# Patient Record
Sex: Female | Born: 1965 | Race: White | Hispanic: No | Smoking: Never smoker
Health system: Southern US, Community
[De-identification: ages and names within clinical notes are randomized; demographics above are authoritative.]

## PROBLEM LIST (undated history)

## (undated) DIAGNOSIS — G43909 Migraine, unspecified, not intractable, without status migrainosus: Secondary | ICD-10-CM

## (undated) DIAGNOSIS — D649 Anemia, unspecified: Secondary | ICD-10-CM

## (undated) DIAGNOSIS — Z5189 Encounter for other specified aftercare: Secondary | ICD-10-CM

## (undated) DIAGNOSIS — N200 Calculus of kidney: Secondary | ICD-10-CM

## (undated) DIAGNOSIS — J45909 Unspecified asthma, uncomplicated: Secondary | ICD-10-CM

## (undated) DIAGNOSIS — K219 Gastro-esophageal reflux disease without esophagitis: Secondary | ICD-10-CM

## (undated) HISTORY — DX: Unspecified asthma, uncomplicated: J45.909

## (undated) HISTORY — DX: Anemia, unspecified: D64.9

## (undated) HISTORY — PX: SPLENECTOMY: SUR1306

## (undated) HISTORY — DX: Encounter for other specified aftercare: Z51.89

## (undated) HISTORY — DX: Gastro-esophageal reflux disease without esophagitis: K21.9

## (undated) HISTORY — DX: Calculus of kidney: N20.0

## (undated) HISTORY — PX: TUBAL LIGATION: SHX77

## (undated) HISTORY — PX: POLYPECTOMY: SHX149

## (undated) HISTORY — PX: UTERINE FIBROID SURGERY: SHX826

---

## 2007-07-30 ENCOUNTER — Ambulatory Visit: Payer: Self-pay | Admitting: Internal Medicine

## 2007-08-08 ENCOUNTER — Encounter: Admission: RE | Admit: 2007-08-08 | Discharge: 2007-08-08 | Payer: Self-pay | Admitting: Internal Medicine

## 2007-09-06 ENCOUNTER — Ambulatory Visit: Payer: Self-pay | Admitting: Internal Medicine

## 2007-09-13 ENCOUNTER — Telehealth: Payer: Self-pay | Admitting: Internal Medicine

## 2007-10-01 ENCOUNTER — Telehealth: Payer: Self-pay | Admitting: Internal Medicine

## 2007-10-15 ENCOUNTER — Encounter: Payer: Self-pay | Admitting: Internal Medicine

## 2007-10-15 ENCOUNTER — Ambulatory Visit: Payer: Self-pay

## 2007-10-21 ENCOUNTER — Telehealth: Payer: Self-pay | Admitting: Internal Medicine

## 2007-10-22 ENCOUNTER — Telehealth: Payer: Self-pay | Admitting: Internal Medicine

## 2007-10-23 ENCOUNTER — Encounter: Admission: RE | Admit: 2007-10-23 | Discharge: 2007-10-23 | Payer: Self-pay | Admitting: Internal Medicine

## 2007-11-11 ENCOUNTER — Telehealth: Payer: Self-pay | Admitting: Internal Medicine

## 2007-12-30 ENCOUNTER — Other Ambulatory Visit: Admission: RE | Admit: 2007-12-30 | Discharge: 2007-12-30 | Payer: Self-pay | Admitting: Family Medicine

## 2008-04-07 ENCOUNTER — Encounter: Admission: RE | Admit: 2008-04-07 | Discharge: 2008-04-07 | Payer: Self-pay | Admitting: Family Medicine

## 2008-10-15 ENCOUNTER — Encounter: Admission: RE | Admit: 2008-10-15 | Discharge: 2008-10-15 | Payer: Self-pay | Admitting: Family Medicine

## 2008-10-21 ENCOUNTER — Encounter: Admission: RE | Admit: 2008-10-21 | Discharge: 2008-10-21 | Payer: Self-pay | Admitting: Family Medicine

## 2009-01-11 ENCOUNTER — Other Ambulatory Visit: Admission: RE | Admit: 2009-01-11 | Discharge: 2009-01-11 | Payer: Self-pay | Admitting: Pediatrics

## 2009-10-18 ENCOUNTER — Encounter: Admission: RE | Admit: 2009-10-18 | Discharge: 2009-10-18 | Payer: Self-pay | Admitting: Family Medicine

## 2010-01-19 ENCOUNTER — Other Ambulatory Visit: Admission: RE | Admit: 2010-01-19 | Discharge: 2010-01-19 | Payer: Self-pay | Admitting: *Deleted

## 2010-02-01 ENCOUNTER — Encounter: Admission: RE | Admit: 2010-02-01 | Discharge: 2010-02-01 | Payer: Self-pay | Admitting: *Deleted

## 2010-02-04 ENCOUNTER — Encounter: Admission: RE | Admit: 2010-02-04 | Discharge: 2010-02-04 | Payer: Self-pay | Admitting: *Deleted

## 2010-02-24 ENCOUNTER — Ambulatory Visit (HOSPITAL_COMMUNITY): Admission: RE | Admit: 2010-02-24 | Discharge: 2010-02-24 | Payer: Self-pay | Admitting: Obstetrics and Gynecology

## 2010-10-28 ENCOUNTER — Encounter: Admission: RE | Admit: 2010-10-28 | Discharge: 2010-10-28 | Payer: Self-pay | Admitting: *Deleted

## 2010-12-19 ENCOUNTER — Encounter: Payer: Self-pay | Admitting: Family Medicine

## 2011-02-20 LAB — BASIC METABOLIC PANEL
CO2: 26 mEq/L (ref 19–32)
Chloride: 106 mEq/L (ref 96–112)
GFR calc Af Amer: >60 mL/min (ref >60)
Glucose, Bld: 89 mg/dL (ref 70–99)
Sodium: 138 mEq/L (ref 135–145)

## 2011-02-20 LAB — CBC
Hemoglobin: 12.3 g/dL (ref 12.0–15.0)
MCHC: 33.7 g/dL (ref 30.0–36.0)
MCV: 91.9 fL (ref 78.0–100.0)
RBC: 3.98 MIL/uL (ref 3.87–5.11)
RDW: 16.1 % — ABNORMAL HIGH (ref 11.5–15.5)

## 2011-03-06 ENCOUNTER — Other Ambulatory Visit: Payer: Self-pay | Admitting: Gynecology

## 2011-03-15 ENCOUNTER — Other Ambulatory Visit: Payer: Self-pay | Admitting: Gynecology

## 2011-04-11 NOTE — Assessment & Plan Note (Signed)
[facility]                           PRIMARY CARE OFFICE NOTE   NAME:[name] [name]                         MRN:          [id number]  DATE:07/30/2007                            DOB:          [birth year]    CHIEF COMPLAINT:  New patient to practice/headaches with numbness on the  left side of face.   HISTORY OF PRESENT ILLNESS:  Patient is a 45 year old white female here  to establish primary care.  She recently moved from [place],  [place], in April 2008.  She is originally from [place].  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  Patient suffered splenic injury as well as perforated bowel.  She is due  for her routine vaccinations including HIB, Pneumovax, meningococcal  vaccine.   She has a history of migraine headache and takes Imitrex four to nine  times per month.  She was previously on Celexa for possible anxiety  disorder/social phobia.  This did not seem to effect migraine frequency.   Over the last one month she has experienced change in her headache  pattern.  She noticed left-sided headaches and perioral numbness  especially left side of her mouth.  She is an avid runner and had to  slow down her training schedule due to exacerbation of headaches after  running.  She also notes exacerbation of headache with coughing and  bending forward.  Currently the severity of her headache is 5/10.  She  denies any other neurologic deficit/complaints.   CURRENT MEDICATION:  Imitrex as needed.   ALLERGIES:  PENICILLIN and VICODIN which causes angioedema.   PAST MEDICAL HISTORY:  1. 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  2. Repair of perforated bowel in 1977.  3. Tubal ligation in 1997.  4. History of migraines.  5. Hyperlipidemia.  6. Allergic rhinitis.   SOCIAL HISTORY:  Patient is married, does not have any children.  Currently works as an Programmer, multimedia.   FAMILY HISTORY:  Father and mother  are both 69.  Father has high  cholesterol, possible bipolar disorder/depression.  There is alcoholism  in her grandparents.  Cousin has breast cancer and uncle noted to have  colon cancer.   HABITS:  Two glasses of wine per night.  No history of tobacco abuse or  recreational drug use.   REVIEW OF SYSTEMS:  As noted above, she denies any visual field deficit,  double vision, blurry vision.  No chest pain, palpitations, dyspnea on  exertion.  Denies heartburn, nausea, vomiting, constipation, diarrhea.  All other systems negative.   PHYSICAL EXAMINATION:  VITAL SIGNS:  Height is 5 feet 6, weight is 148.2  pounds, temperature 98.1, pulse 93, blood pressure 120/72 in the left  arm in the seated position.  GENERAL APPEARANCE:  The patient is a pleasant, thin, 49 -year-old white  female who appears her stated age.  HEENT:  Normocephalic and atraumatic.  Pupils are equal and reactive to  light bilaterally.  Extraocular motility was intact.  Patient was  anicteric.  Conjunctivae within normal  limits.  There was no evidence of  nystagmus.  Visual field was normal bilaterally.  Oropharyngeal exam was  unremarkable.  Uvula was midline.  External auditory canals and tympanic  membranes were clear.  NECK:  Supple, no adenopathy, carotid bruit or thyromegaly.  CHEST:  Normal respiratory effort.  Chest was clear to auscultation  bilaterally.  No wheezing, rhonchi or rales.  CARDIOVASCULAR:  Regular rate and rhythm with no significant murmurs,  rubs, or gallops appreciated.  ABDOMEN:  Soft and nontender with positive bowel sounds and no  organomegaly.  MUSCULOSKELETAL:  No clubbing, cyanosis, or edema.  SKIN:  Warm and dry.  NEUROLOGIC:  Cranial nerves II-XII grossly intact.  She was nonfocal.  She did not have any pronator drift.  No cerebellar signs.  Muscle  strength was 5/5 throughout.  Reflexes were +1 to 2 upper extremity, +1  patella, +1 Achilles.  Did not have any Babinski's  response.   IMPRESSION/RECOMMENDATIONS:  1. Headache with oral paresthesia.  2. Chronic migraine headache.  3. History of anxiety disorder/social phobia.  4. 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  5. History of hyperlipidemia.  6. Health maintenance.   RECOMMENDATIONS:  1. Patient's headache pattern with exacerbation with coughing and      bending forward worrisome for intracranial lesion.  Obtain MRI/MRA      of the brain without contrast.  2. We discussed possibly using Topamax for migraine headaches if MRI      is unremarkable.  3. Patient will be updated with Pneumovax and meningococcal vaccine      today.  On follow-up visit, we will update patient with HIB      vaccine.  4. She was given a prescription for Avelox 400 mg one p.o. daily to      have on hand.  We      discussed low threshold for using antibiotics in case of high      fever.  5. Obtain labs from previous physician performed in January 2008.  6. Follow up office visit after MRI.     Barbette Hair. Artist Pais, DO  Electronically Signed    RDY/MedQ  DD: 07/30/2007  DT: 07/31/2007  Job #: [id number]

## 2011-04-14 ENCOUNTER — Other Ambulatory Visit: Payer: Self-pay | Admitting: Gynecology

## 2011-04-14 ENCOUNTER — Ambulatory Visit (HOSPITAL_BASED_OUTPATIENT_CLINIC_OR_DEPARTMENT_OTHER)
Admission: RE | Admit: 2011-04-14 | Discharge: 2011-04-14 | Disposition: A | Discharge status: Home / Self Care | Payer: PRIVATE HEALTH INSURANCE | Source: Ambulatory Visit | Attending: Gynecology | Admitting: Gynecology

## 2011-04-14 DIAGNOSIS — N938 Other specified abnormal uterine and vaginal bleeding: Secondary | ICD-10-CM | POA: Insufficient documentation

## 2011-04-14 DIAGNOSIS — N949 Unspecified condition associated with female genital organs and menstrual cycle: Secondary | ICD-10-CM | POA: Insufficient documentation

## 2011-04-14 DIAGNOSIS — D250 Submucous leiomyoma of uterus: Secondary | ICD-10-CM | POA: Insufficient documentation

## 2011-05-12 NOTE — Op Note (Signed)
  NAME:  [name] [name], [name] [name] NO.:  [id number]  MEDICAL RECORD NO.:  [id number]          PATIENT TYPE:  LOCATION:  [facility]                         FACILITY:  [facility]  PHYSICIAN:  Gretta Cool, M.D. DATE OF BIRTH:  October 29, 1966  DATE OF PROCEDURE:  04/14/2011 DATE OF DISCHARGE:                              OPERATIVE REPORT   PREOPERATIVE DIAGNOSES:  Enormous submucous fibroid with 2 previous attempts at hysteroscopic resection elsewhere with abnormal uterine bleeding.  POSTOPERATIVE DIAGNOSES:  Enormous submucous fibroid with 2 previous attempts at hysteroscopic resection elsewhere with abnormal uterine bleeding.  PROCEDURE:  Hysteroscopy, resection of enormous submucous fibroid total, plus total endometrial ablation by resection, plus VaporTrode ablation.  SURGEON:  Gretta Cool, M.D.  ANESTHESIA:  IV sedation, paracervical block.  DESCRIPTION OF PROCEDURE:  Under excellent anesthesia as above with the patient prepped and draped in Allen stirrups, the cervix was grasped with single-tooth tenaculum and progressively dilated with a series of Pratt dilators to accommodate 7-mm resectoscope.  The cavity was examined and an enormous fibroid was identified, bulging from a broad area of the right uterine wall in the midportion of the fundus.  The fibroid was progressively resected.  It was rocked back and forth to lift it up out of the capsule of the fibroid.  As it was resected, it progressively bulged into the endometrial cavity.  Eventually, approximately 30 to 40 mL of equivalent of rubbery hard tissue was removed until the entire fibroid was resected from the capsule.  The base of the capsule was treated by cautery to control bleeding and then by VaporTrode.  The entire endometrial cavity was then totally resected down 5 mm or more into the myometrium, so as to remove all gland openings.  The corneal areas were then treated by touch technique with VaporTrode  electrode so as to completely eliminate endometrial glands in the superficial myometrium.  The entire cavity was then treated with VaporTrode to complete the ablation.  At this point, the procedure was terminated without complications.  There was minimal bleeding at reduced pressure. At the end of the procedure, the patient was given Toradol IV for comfort postoperatively.          ______________________________ Gretta Cool, M.D.     CWL/MEDQ  D:  04/14/2011  T:  04/14/2011  Job:  [id number]  cc:   Leonette Most A. Sydnee Cabal, MD Fax: [phone]  [facility]  Electronically Signed by Beather Arbour M.D. on 05/12/2011 11:50:36 AM

## 2011-09-13 ENCOUNTER — Other Ambulatory Visit: Payer: Self-pay | Admitting: Gynecology

## 2011-09-20 ENCOUNTER — Other Ambulatory Visit: Payer: Self-pay | Admitting: Family Medicine

## 2011-09-20 DIAGNOSIS — Z1231 Encounter for screening mammogram for malignant neoplasm of breast: Secondary | ICD-10-CM

## 2011-10-30 ENCOUNTER — Ambulatory Visit
Admission: RE | Admit: 2011-10-30 | Discharge: 2011-10-30 | Disposition: A | Discharge status: Home / Self Care | Payer: PRIVATE HEALTH INSURANCE | Source: Ambulatory Visit | Attending: Family Medicine | Admitting: Family Medicine

## 2011-10-30 DIAGNOSIS — Z1231 Encounter for screening mammogram for malignant neoplasm of breast: Secondary | ICD-10-CM

## 2012-03-18 ENCOUNTER — Other Ambulatory Visit: Payer: Self-pay | Admitting: Gynecology

## 2012-08-26 ENCOUNTER — Encounter (HOSPITAL_COMMUNITY): Payer: Self-pay | Admitting: *Deleted

## 2012-08-26 ENCOUNTER — Emergency Department (HOSPITAL_COMMUNITY)
Admission: EM | Admit: 2012-08-26 | Discharge: 2012-08-26 | Discharge status: Against Medical Advice | Payer: Commercial Managed Care - PPO | Attending: Emergency Medicine | Admitting: Emergency Medicine

## 2012-08-26 DIAGNOSIS — G43909 Migraine, unspecified, not intractable, without status migrainosus: Secondary | ICD-10-CM | POA: Insufficient documentation

## 2012-08-26 HISTORY — DX: Migraine, unspecified, not intractable, without status migrainosus: G43.909

## 2012-08-26 NOTE — ED Notes (Signed)
Pt states she is tired of waiting and is going to leave  Encouraged to stay  Pt chose to leave anyway

## 2012-08-26 NOTE — ED Notes (Signed)
Pt c/o migraine x's "77hours" states has hx of same but no improvement in symptoms with home medication. Pt reports photophobia and noise sensitivity.

## 2012-10-01 ENCOUNTER — Other Ambulatory Visit: Payer: Self-pay | Admitting: Family Medicine

## 2012-10-01 DIAGNOSIS — Z1231 Encounter for screening mammogram for malignant neoplasm of breast: Secondary | ICD-10-CM

## 2012-10-09 ENCOUNTER — Encounter (HOSPITAL_COMMUNITY): Payer: Self-pay

## 2012-10-09 ENCOUNTER — Emergency Department (HOSPITAL_COMMUNITY)
Admission: EM | Admit: 2012-10-09 | Discharge: 2012-10-09 | Disposition: A | Discharge status: Home / Self Care | Payer: Commercial Managed Care - PPO | Attending: Emergency Medicine | Admitting: Emergency Medicine

## 2012-10-09 ENCOUNTER — Emergency Department (HOSPITAL_COMMUNITY): Payer: Commercial Managed Care - PPO

## 2012-10-09 DIAGNOSIS — J069 Acute upper respiratory infection, unspecified: Secondary | ICD-10-CM

## 2012-10-09 DIAGNOSIS — R059 Cough, unspecified: Secondary | ICD-10-CM

## 2012-10-09 DIAGNOSIS — J029 Acute pharyngitis, unspecified: Secondary | ICD-10-CM | POA: Insufficient documentation

## 2012-10-09 DIAGNOSIS — R05 Cough: Secondary | ICD-10-CM | POA: Insufficient documentation

## 2012-10-09 DIAGNOSIS — R0602 Shortness of breath: Secondary | ICD-10-CM | POA: Insufficient documentation

## 2012-10-09 DIAGNOSIS — G43909 Migraine, unspecified, not intractable, without status migrainosus: Secondary | ICD-10-CM | POA: Insufficient documentation

## 2012-10-09 DIAGNOSIS — Z79899 Other long term (current) drug therapy: Secondary | ICD-10-CM | POA: Insufficient documentation

## 2012-10-09 MED ORDER — PREDNISONE 10 MG PO TABS: ORAL_TABLET | ORAL | Status: DC

## 2012-10-09 MED ORDER — GUAIFENESIN-CODEINE 100-10 MG/5ML PO SYRP: 5.0000 mL | ORAL_SOLUTION | Freq: Three times a day (TID) | ORAL | Status: DC | PRN

## 2012-10-09 NOTE — ED Notes (Signed)
Pt being treated for UTI- medication unknown to this pt- started med last night

## 2012-10-09 NOTE — ED Notes (Signed)
Pt presents with NAD_ Pt states URI since Saturday- productive cough started this am-  Pt feels mucous is stuck in throat.  Pt able to speak in complete sentences and handle secretions.  Pt denies CP N/V/D and fever

## 2012-10-09 NOTE — ED Notes (Signed)
Patient transported to CT 

## 2012-10-09 NOTE — ED Provider Notes (Signed)
History     CSN: [id number]  Arrival date & time 10/09/12  1227   First MD Initiated Contact with Patient 10/09/12 1338      Chief Complaint  Patient presents with  . Shortness of Breath  . URI  . Cough    (Consider location/radiation/quality/duration/timing/severity/associated sxs/prior treatment) HPI [name] [name] is a 46 y.o. female who presents with complaint of a cough and difficulty breathing. Pt states she has had URI symptoms for the last 3 days. States productive cough, sore throat. States at times, she has a mucus ball getting stuck in her throat which makes her choke and she is unable to breath. States these episodes scare her that she will choke and wont able to clear airway on her own. Pt denies fever, chills, chest pain. No SOB unless has mucus in her throat. Did not try any medications. Recently started on macrobid for UTI. Past Medical History  Diagnosis Date  . Migraine     Past Surgical History  Procedure Date  . Uterine fibroid surgery   . Splenectomy     No family history on file.  History  Substance Use Topics  . Smoking status: Not on file  . Smokeless tobacco: Not on file  . Alcohol Use: Yes    OB History    Grav Para Term Preterm Abortions TAB SAB Ect Mult Living                  Review of Systems  Constitutional: Negative for fever and chills.  HENT: Negative for congestion, neck pain and neck stiffness.   Respiratory: Negative.   Cardiovascular: Negative.   Gastrointestinal: Negative for nausea, vomiting and abdominal pain.  Musculoskeletal: Negative.   Skin: Negative.   Neurological: Negative for weakness and numbness.    Allergies  Grapefruit extract; Vicodin; and Penicillins  Home Medications   Current Outpatient Rx  Name  Route  Sig  Dispense  Refill  . NITROFURANTOIN MONOHYD MACRO 100 MG PO CAPS   Oral   Take 100 mg by mouth 2 (two) times daily.         . SUMATRIPTAN SUCCINATE 100 MG PO TABS   Oral   Take 50  mg by mouth every 2 (two) hours as needed. For migraine         . TOPIRAMATE 50 MG PO TABS   Oral   Take 50 mg by mouth every evening.         . GUAIFENESIN-CODEINE 100-10 MG/5ML PO SYRP   Oral   Take 5 mLs by mouth 3 (three) times daily as needed for cough.   120 mL   0   . PREDNISONE 10 MG PO TABS      Take 5 tab day 1, take 4 tab day 2, take 3 tab day 3, take 2 tab day 4, and take 1 tab day 5   15 tablet   0     BP 110/58  Pulse 98  Temp 97.7 F (36.5 C) (Oral)  Resp 16  Ht 5\' 6"  (1.676 m)  Wt 150 lb (68.04 kg)  BMI 24.21 kg/m2  SpO2 100%  LMP 09/18/2012  Physical Exam  Nursing note and vitals reviewed. Constitutional: She appears well-developed and well-nourished. No distress.  HENT:  Head: Normocephalic and atraumatic.  Right Ear: External ear normal.  Left Ear: External ear normal.  Mouth/Throat: Oropharynx is clear and moist.       Nasal congestion  Eyes: Conjunctivae normal are normal.  Neck: Neck supple.  Cardiovascular: Normal rate, regular rhythm and normal heart sounds.   Pulmonary/Chest: Effort normal and breath sounds normal. No respiratory distress. She has no wheezes. She has no rales.  Musculoskeletal: She exhibits no edema.  Lymphadenopathy:    She has no cervical adenopathy.  Neurological: She is alert.  Skin: Skin is warm and dry.  Psychiatric: She has a normal mood and affect.    ED Course  Procedures (including critical care time)  Labs Reviewed - No data to display Dg Chest 2 View  10/09/2012  *RADIOLOGY REPORT*  Clinical Data: Short of breath.  Cough.  CHEST - 2 VIEW  Comparison: None.  Findings: Heart size is normal.  Mediastinal shadows are normal. Lungs are clear.  No effusions.  Minimal spinal curvature.  No acute bony finding.  IMPRESSION: Normal chest except for minimal spinal curvature.   Original Report Authenticated By: Paulina Fusi, M.D.    Filed Vitals:   10/09/12 1233 10/09/12 1459  BP: 116/67 110/58  Pulse: 98     Temp: 97.7 F (36.5 C)   TempSrc: Oral   Resp: 16   Height: 5\' 6"  (1.676 m)   Weight: 150 lb (68.04 kg)   SpO2: 100%       1. Cough   2. URI (upper respiratory infection)       MDM  Pt with cough, intermittent choking on her sputum. She is in no distress in the room. Able to speak in full sentenses. VS normal. She is able to cough up the mucus. Will start on cough suppressants, prednisone for inflammation, fluids at home. Will d/c home with close follow up. Pt agreeable to the plan.         Lottie Mussel, PA 10/09/12 1559

## 2012-10-09 NOTE — Discharge Instructions (Signed)
Start taking cough medication as prescribed to suppress your cough. Drink plenty of fluids. Try steamy room to help to break down mucus. Take prednisone to reduce inflammation as prescribed. Follow up with your doctor as needed. Return if worsening. Your chest x-ray today is normal.   Cough, Adult  A cough is a reflex that helps clear your throat and airways. It can help heal the body or may be a reaction to an irritated airway. A cough may only last 2 or 3 weeks (acute) or may last more than 8 weeks (chronic).  CAUSES Acute cough:  Viral or bacterial infections. Chronic cough:  Infections.  Allergies.  Asthma.  Post-nasal drip.  Smoking.  Heartburn or acid reflux.  Some medicines.  Chronic lung problems (COPD).  Cancer. SYMPTOMS   Cough.  Fever.  Chest pain.  Increased breathing rate.  High-pitched whistling sound when breathing (wheezing).  Colored mucus that you cough up (sputum). TREATMENT   A bacterial cough may be treated with antibiotic medicine.  A viral cough must run its course and will not respond to antibiotics.  Your caregiver may recommend other treatments if you have a chronic cough. HOME CARE INSTRUCTIONS   Only take over-the-counter or prescription medicines for pain, discomfort, or fever as directed by your caregiver. Use cough suppressants only as directed by your caregiver.  Use a cold steam vaporizer or humidifier in your bedroom or home to help loosen secretions.  Sleep in a semi-upright position if your cough is worse at night.  Rest as needed.  Stop smoking if you smoke. SEEK IMMEDIATE MEDICAL CARE IF:   You have pus in your sputum.  Your cough starts to worsen.  You cannot control your cough with suppressants and are losing sleep.  You begin coughing up blood.  You have difficulty breathing.  You develop pain which is getting worse or is uncontrolled with medicine.  You have a fever. MAKE SURE YOU:   Understand these  instructions.  Will watch your condition.  Will get help right away if you are not doing well or get worse. Document Released: 05/12/2011 Document Revised: 02/05/2012 Document Reviewed: 05/12/2011 [facility] Patient Information 2013 [place], [place].

## 2012-10-16 NOTE — ED Provider Notes (Signed)
Medical screening examination/treatment/procedure(s) were performed by non-physician practitioner and as supervising physician I was immediately available for consultation/collaboration.  Cheri Guppy, MD 10/16/12 1121

## 2012-10-21 ENCOUNTER — Ambulatory Visit
Admission: RE | Admit: 2012-10-21 | Discharge: 2012-10-21 | Disposition: A | Discharge status: Home / Self Care | Payer: Commercial Managed Care - PPO | Source: Ambulatory Visit | Attending: Family Medicine | Admitting: Family Medicine

## 2012-10-21 ENCOUNTER — Other Ambulatory Visit: Payer: Self-pay | Admitting: Family Medicine

## 2012-10-21 DIAGNOSIS — M25579 Pain in unspecified ankle and joints of unspecified foot: Secondary | ICD-10-CM

## 2012-11-12 ENCOUNTER — Ambulatory Visit
Admission: RE | Admit: 2012-11-12 | Discharge: 2012-11-12 | Disposition: A | Discharge status: Home / Self Care | Payer: Commercial Managed Care - PPO | Source: Ambulatory Visit | Attending: Family Medicine | Admitting: Family Medicine

## 2012-11-12 DIAGNOSIS — Z1231 Encounter for screening mammogram for malignant neoplasm of breast: Secondary | ICD-10-CM

## 2014-02-13 ENCOUNTER — Ambulatory Visit
Admission: RE | Admit: 2014-02-13 | Discharge: 2014-02-13 | Disposition: A | Discharge status: Home / Self Care | Payer: Commercial Managed Care - PPO | Source: Ambulatory Visit | Attending: Family | Admitting: Family

## 2014-02-13 ENCOUNTER — Other Ambulatory Visit: Payer: Self-pay | Admitting: Family

## 2014-02-13 ENCOUNTER — Emergency Department (HOSPITAL_COMMUNITY)
Admission: EM | Admit: 2014-02-13 | Discharge: 2014-02-13 | Disposition: A | Discharge status: Home / Self Care | Payer: Commercial Managed Care - PPO | Source: Home / Self Care

## 2014-02-13 DIAGNOSIS — M545 Low back pain, unspecified: Secondary | ICD-10-CM

## 2014-02-13 DIAGNOSIS — N12 Tubulo-interstitial nephritis, not specified as acute or chronic: Secondary | ICD-10-CM

## 2014-02-13 DIAGNOSIS — R319 Hematuria, unspecified: Secondary | ICD-10-CM

## 2014-02-16 ENCOUNTER — Other Ambulatory Visit: Payer: Self-pay | Admitting: Family

## 2014-02-16 DIAGNOSIS — N83209 Unspecified ovarian cyst, unspecified side: Secondary | ICD-10-CM

## 2014-02-19 ENCOUNTER — Ambulatory Visit
Admission: RE | Admit: 2014-02-19 | Discharge: 2014-02-19 | Disposition: A | Discharge status: Home / Self Care | Payer: Commercial Managed Care - PPO | Source: Ambulatory Visit | Attending: Family | Admitting: Family

## 2014-02-19 DIAGNOSIS — N83209 Unspecified ovarian cyst, unspecified side: Secondary | ICD-10-CM

## 2014-03-06 ENCOUNTER — Encounter: Payer: Self-pay | Admitting: Internal Medicine

## 2014-04-27 ENCOUNTER — Ambulatory Visit (INDEPENDENT_AMBULATORY_CARE_PROVIDER_SITE_OTHER): Payer: Commercial Managed Care - PPO | Admitting: Internal Medicine

## 2014-04-27 ENCOUNTER — Encounter: Payer: Self-pay | Admitting: Internal Medicine

## 2014-04-27 VITALS — BP 100/68 | HR 84 | Ht 65.5 in | Wt 153.0 lb

## 2014-04-27 DIAGNOSIS — R1310 Dysphagia, unspecified: Secondary | ICD-10-CM

## 2014-04-27 DIAGNOSIS — K219 Gastro-esophageal reflux disease without esophagitis: Secondary | ICD-10-CM

## 2014-04-27 NOTE — Progress Notes (Signed)
HISTORY OF PRESENT ILLNESS:  [name] [name] is a 48 y.o. female who presents today regarding GERD. The patient reports that she developed a "scratchy throat" proximal in 1 year ago. She was evaluated by ear nose and throat specialist in November and apparently underwent laryngoscopy which revealed "inflammation". She was told that she had GERD and was placed on omeprazole 40 mg daily. Her symptoms resolved. She came off medication with recurrent symptoms. She resume medication with resolution of symptoms. She is on medication at this time. She has no classic reflux symptoms such as pyrosis. She does report difficulty initiating swallowing with liquids. No true solid food dysphagia. There is a history of esophageal cancer in the family. Her GI review of systems is otherwise negative  REVIEW OF SYSTEMS:  All non-GI ROS negative except for anxiety  Past Medical History  Diagnosis Date  . Migraine   . GERD (gastroesophageal reflux disease)     Past Surgical History  Procedure Laterality Date  . Uterine fibroid surgery    . Splenectomy      Social History [name] [name]  reports that she has never smoked. She has never used smokeless tobacco. She reports that she drinks alcohol. She reports that she does not use illicit drugs.  family history includes Lung cancer in her maternal grandmother and maternal uncle.  Allergies  Allergen Reactions  . Grapefruit Extract     Results of an allergy test  . Vicodin [Hydrocodone-Acetaminophen]   . Penicillins Swelling and Rash       PHYSICAL EXAMINATION: Vital signs: BP 100/68  Pulse 84  Ht 5' 5.5" (1.664 m)  Wt 153 lb (69.4 kg)  BMI 25.06 kg/m2  LMP 04/13/2014 General: Well-developed, well-nourished, no acute distress HEENT: Sclerae are anicteric, conjunctiva pink. Oral mucosa intact Lungs: Clear Heart: Regular Abdomen: soft, nontender, nondistended, no obvious ascites, no peritoneal signs, normal bowel sounds. No organomegaly.  Large transverse upper abdominal incision well-healed without hernia Extremities: No edema Psychiatric: alert and oriented x3. Cooperative     ASSESSMENT:  #1. GERD with pharyngeal symptoms responsive to PPI #2. Vague liquid dysphagia. Question spasm #3. Screening colonoscopy. Due at age 70. She is aware   PLAN: #1. Reflux precautions #2. Continue PPI as this is helping #3. Upper endoscopy to evaluate chronic GERD and dysphagia.The nature of the procedure, as well as the risks, benefits, and alternatives were carefully and thoroughly reviewed with the patient. Ample time for discussion and questions allowed. The patient understood, was satisfied, and agreed to proceed. #4. Contact the office to set up screening colonoscopy at your convenience

## 2014-04-27 NOTE — Patient Instructions (Signed)

## 2014-05-05 ENCOUNTER — Encounter: Payer: Commercial Managed Care - PPO | Admitting: Internal Medicine

## 2014-05-07 ENCOUNTER — Encounter: Payer: Self-pay | Admitting: Internal Medicine

## 2014-05-07 ENCOUNTER — Ambulatory Visit (AMBULATORY_SURGERY_CENTER): Payer: Commercial Managed Care - PPO | Admitting: Internal Medicine

## 2014-05-07 VITALS — BP 106/67 | HR 78 | Temp 97.8°F | Resp 12 | Ht 65.5 in | Wt 153.0 lb

## 2014-05-07 DIAGNOSIS — K219 Gastro-esophageal reflux disease without esophagitis: Secondary | ICD-10-CM

## 2014-05-07 DIAGNOSIS — R1310 Dysphagia, unspecified: Secondary | ICD-10-CM

## 2014-05-07 MED ORDER — SODIUM CHLORIDE 0.9 % IV SOLN: 500.0000 mL | INTRAVENOUS | Status: DC

## 2014-05-07 NOTE — Patient Instructions (Signed)

## 2014-05-07 NOTE — Op Note (Signed)
[place]  [address]. [place], [address]   ENDOSCOPY PROCEDURE REPORT  PATIENT: [name] [name], [name] [name]  MR#: [id number] BIRTHDATE: 03-12-1966 , 47  yrs. old GENDER: Female ENDOSCOPIST: Eustace Quail, MD REFERRED BY:  Cammie Fulp PROCEDURE DATE:  05/07/2014 PROCEDURE:  EGD, diagnostic ASA CLASS:     Class II INDICATIONS:  History of esophageal reflux.  atypical symptoms (scratchy throat, difficulty initiating swallowing liquids) MEDICATIONS: MAC sedation, administered by CRNA and propofol (Diprivan) 200mg  IV TOPICAL ANESTHETIC: none  DESCRIPTION OF PROCEDURE: After the risks benefits and alternatives of the procedure were thoroughly explained, informed consent was obtained.  The LB [id number] endoscope was introduced through the mouth and advanced to the second portion of the duodenum. Without limitations.  The instrument was slowly withdrawn as the mucosa was fully examined.      EXAM:The upper, middle and distal third of the esophagus were carefully inspected and no abnormalities were noted.  The z-line was well seen at the GEJ.  The endoscope was pushed into the fundus which was normal including a retroflexed view.  The antrum, gastric body, first and second part of the duodenum were unremarkable. Retroflexed views revealed no abnormalities.     The scope was then withdrawn from the patient and the procedure completed.  COMPLICATIONS: There were no complications. ENDOSCOPIC IMPRESSION: 1.Normal EGD 2. GERD with pharyngeal symptoms  RECOMMENDATIONS: 1.  Anti-reflux regimen to be followed 2.  Continue PPI as needed to control symptoms 3. Return to the care of your primary provider.  REPEAT EXAM:  eSigned:  Eustace Quail, MD 05/07/2014 12:05 PM   CC:The Patient  , Cammie Fulp,MD

## 2014-05-07 NOTE — Progress Notes (Signed)
Report to PACU, RN, vss, BBS= Clear.  

## 2014-05-08 ENCOUNTER — Telehealth: Payer: Self-pay

## 2014-05-08 NOTE — Telephone Encounter (Signed)
  Follow up Call-  Call back number 05/07/2014  Post procedure Call Back phone  # [phone]  Permission to leave phone message Yes     Patient questions:  Do you have a fever, pain , or abdominal swelling? no Pain Score  0 *  Have you tolerated food without any problems? yes  Have you been able to return to your normal activities? yes  Do you have any questions about your discharge instructions: Diet   no Medications  no Follow up visit  no  Do you have questions or concerns about your Care? no  Actions: * If pain score is 4 or above: No action needed, pain <4.

## 2014-08-27 ENCOUNTER — Other Ambulatory Visit: Payer: Self-pay | Admitting: Family Medicine

## 2014-08-27 ENCOUNTER — Other Ambulatory Visit (HOSPITAL_COMMUNITY)
Admission: RE | Admit: 2014-08-27 | Discharge: 2014-08-27 | Disposition: A | Discharge status: Home / Self Care | Payer: Commercial Managed Care - PPO | Source: Ambulatory Visit | Attending: Family Medicine | Admitting: Family Medicine

## 2014-08-27 DIAGNOSIS — Z0000 Encounter for general adult medical examination without abnormal findings: Secondary | ICD-10-CM | POA: Insufficient documentation

## 2014-09-01 LAB — CYTOLOGY - PAP

## 2014-12-11 ENCOUNTER — Other Ambulatory Visit: Payer: Self-pay

## 2014-12-11 DIAGNOSIS — Z1231 Encounter for screening mammogram for malignant neoplasm of breast: Secondary | ICD-10-CM

## 2014-12-16 ENCOUNTER — Ambulatory Visit: Payer: Commercial Managed Care - PPO

## 2015-01-11 ENCOUNTER — Ambulatory Visit: Payer: Commercial Managed Care - PPO

## 2015-06-03 ENCOUNTER — Emergency Department (HOSPITAL_COMMUNITY)
Admission: EM | Admit: 2015-06-03 | Discharge: 2015-06-04 | Discharge status: Against Medical Advice | Payer: Commercial Managed Care - PPO | Attending: Emergency Medicine | Admitting: Emergency Medicine

## 2015-06-03 DIAGNOSIS — Z862 Personal history of diseases of the blood and blood-forming organs and certain disorders involving the immune mechanism: Secondary | ICD-10-CM | POA: Diagnosis not present

## 2015-06-03 DIAGNOSIS — Z79899 Other long term (current) drug therapy: Secondary | ICD-10-CM | POA: Diagnosis not present

## 2015-06-03 DIAGNOSIS — Z87442 Personal history of urinary calculi: Secondary | ICD-10-CM | POA: Diagnosis not present

## 2015-06-03 DIAGNOSIS — G43909 Migraine, unspecified, not intractable, without status migrainosus: Secondary | ICD-10-CM | POA: Diagnosis not present

## 2015-06-03 DIAGNOSIS — K219 Gastro-esophageal reflux disease without esophagitis: Secondary | ICD-10-CM | POA: Insufficient documentation

## 2015-06-03 DIAGNOSIS — F329 Major depressive disorder, single episode, unspecified: Secondary | ICD-10-CM | POA: Diagnosis not present

## 2015-06-03 DIAGNOSIS — Z880 Allergy status to penicillin: Secondary | ICD-10-CM | POA: Diagnosis not present

## 2015-06-03 DIAGNOSIS — F32A Depression, unspecified: Secondary | ICD-10-CM

## 2015-06-03 DIAGNOSIS — R45851 Suicidal ideations: Secondary | ICD-10-CM | POA: Diagnosis present

## 2015-06-04 ENCOUNTER — Encounter (HOSPITAL_COMMUNITY): Payer: Self-pay

## 2015-06-04 MED ORDER — IBUPROFEN 200 MG PO TABS
600.0000 mg | ORAL_TABLET | Freq: Three times a day (TID) | ORAL | Status: DC | PRN
Start: 2015-06-04 — End: 2015-06-04

## 2015-06-04 MED ORDER — ONDANSETRON HCL 4 MG PO TABS: 4.0000 mg | ORAL_TABLET | Freq: Three times a day (TID) | ORAL | Status: DC | PRN

## 2015-06-04 MED ORDER — ALUM & MAG HYDROXIDE-SIMETH 200-200-20 MG/5ML PO SUSP: 30.0000 mL | ORAL | Status: DC | PRN

## 2015-06-04 MED ORDER — ZOLPIDEM TARTRATE 5 MG PO TABS: 5.0000 mg | ORAL_TABLET | Freq: Every evening | ORAL | Status: DC | PRN

## 2015-06-04 NOTE — ED Notes (Signed)
Patient refused lab work. RN notified.

## 2015-06-04 NOTE — ED Notes (Signed)
Pt states that she is suicidal and depressed because she went through a divorce 4 years ago and can't get over it, she feels like she's  too old to find anyone to be with, her dog died, she's having financial problems and she's scared she's going to be alone forever. She has no plan of SI, but doesn't know what else to do.

## 2015-06-04 NOTE — ED Provider Notes (Signed)
CSN: [id number]     Arrival date & time 06/03/15  2345 History   First MD Initiated Contact with Patient 06/04/15 0038     Chief Complaint  Patient presents with  . Suicidal     (Consider location/radiation/quality/duration/timing/severity/associated sxs/prior Treatment) HPI 49 yo female presents to the ER with complaint of worsening depression, hopelessness, and suicidal thoughts.  Pt reports she has felt desperate for the last year.  She had been on lexapro which helped greatly, but she had weight gain causing her to stop taking it.  She had been seeing a therapist who she did not like, stopped seeing her about 7 weeks ago.  Pt feels hopeless as she is divorced, not dating anyone, and feels she will continue feeling miserable and die alone.  Pt does not have a plan.  She was talking with an exboyfriend tonight and mentioned feeling suicidal, and he called the police.  Pt does not have a gun, has not thought about overdosing or other methods.   Past Medical History  Diagnosis Date  . Migraine   . GERD (gastroesophageal reflux disease)   . Anemia   . Blood transfusion without reported diagnosis   . Kidney stones    Past Surgical History  Procedure Laterality Date  . Uterine fibroid surgery    . Splenectomy    . Tubal ligation     Family History  Problem Relation Age of Onset  . Lung cancer Maternal Grandmother   . Lung cancer Maternal Uncle   . Colon cancer Maternal Uncle    History  Substance Use Topics  . Smoking status: Never Smoker   . Smokeless tobacco: Never Used  . Alcohol Use: Yes     Comment: OCC.   OB History    No data available     Review of Systems   See History of Present Illness; otherwise all other systems are reviewed and negative    Allergies  Grapefruit extract; Vicodin; and Penicillins  Home Medications   Prior to Admission medications   Medication Sig Start Date End Date Taking? Authorizing Provider  omeprazole (PRILOSEC) 40 MG capsule Take  40 mg by mouth daily.   Yes Historical Provider, MD  SUMAtriptan (IMITREX) 100 MG tablet Take 50 mg by mouth every 2 (two) hours as needed. For migraine   Yes Historical Provider, MD   BP 128/72 mmHg  Pulse 82  Temp(Src) 97.3 F (36.3 C) (Oral)  Resp 14  SpO2 97% Physical Exam  Constitutional: She is oriented to person, place, and time. She appears well-developed and well-nourished.  HENT:  Head: Normocephalic and atraumatic.  Nose: Nose normal.  Mouth/Throat: Oropharynx is clear and moist.  Eyes: Conjunctivae and EOM are normal. Pupils are equal, round, and reactive to light.  Neck: Normal range of motion. Neck supple. No JVD present. No tracheal deviation present. No thyromegaly present.  Cardiovascular: Normal rate, regular rhythm, normal heart sounds and intact distal pulses.  Exam reveals no gallop and no friction rub.   No murmur heard. Pulmonary/Chest: Effort normal and breath sounds normal. No stridor. No respiratory distress. She has no wheezes. She has no rales. She exhibits no tenderness.  Abdominal: Soft. Bowel sounds are normal. She exhibits no distension and no mass. There is no tenderness. There is no rebound and no guarding.  Musculoskeletal: Normal range of motion. She exhibits no edema or tenderness.  Lymphadenopathy:    She has no cervical adenopathy.  Neurological: She is alert and oriented to person, place,  and time. She displays normal reflexes. She exhibits normal muscle tone. Coordination normal.  Skin: Skin is warm and dry. No rash noted. No erythema. No pallor.  Psychiatric: Her behavior is normal. Judgment and thought content normal.  Depression, flat affect.  Passive suicide thoughts  Nursing note and vitals reviewed.   ED Course  Procedures (including critical care time) Labs Review Labs Reviewed  CBC WITH DIFFERENTIAL/PLATELET  COMPREHENSIVE METABOLIC PANEL  URINE RAPID DRUG SCREEN, HOSP PERFORMED  ETHANOL  SALICYLATE LEVEL  ACETAMINOPHEN LEVEL     Imaging Review No results found.   EKG Interpretation None      MDM   Final diagnoses:  Depression    49 year old female who reports a year of depression, hopelessness.  Patient has been having thoughts about suicide, but does not have a plan.  No prior history of suicide attempts.  Patient had a therapist but did not go along with her.  She had been treated with Lexapro in the past, but gained weight.  We'll have TTS, evaluate   Patient reports that she does not wish to stay that she has appointments to keep in the morning.  She left before TTS could evaluate her.  I do not feel strongly that she was at severe risk to herself.  She contracted for safety.  She left before resource list could be given her.    Linton Flemings, MD 06/04/15 [phone]

## 2015-06-04 NOTE — ED Notes (Signed)
Pt did not wait for the TTS counselor to come with resource information, pt walked out of the ED and had the police department to take her home

## 2015-06-04 NOTE — ED Notes (Signed)
Patient wanting to leave, Dr Sharol Given aware, requesting resources and contract for safety for patient.

## 2015-12-28 ENCOUNTER — Encounter: Payer: Self-pay | Admitting: Allergy and Immunology

## 2015-12-28 ENCOUNTER — Ambulatory Visit (INDEPENDENT_AMBULATORY_CARE_PROVIDER_SITE_OTHER): Payer: Commercial Managed Care - PPO | Admitting: Allergy and Immunology

## 2015-12-28 VITALS — BP 100/68 | HR 76 | Temp 99.0°F | Resp 12 | Ht 64.96 in | Wt 160.9 lb

## 2015-12-28 DIAGNOSIS — K219 Gastro-esophageal reflux disease without esophagitis: Secondary | ICD-10-CM

## 2015-12-28 DIAGNOSIS — H1010 Acute atopic conjunctivitis, unspecified eye: Secondary | ICD-10-CM | POA: Diagnosis not present

## 2015-12-28 DIAGNOSIS — J387 Other diseases of larynx: Secondary | ICD-10-CM | POA: Diagnosis not present

## 2015-12-28 DIAGNOSIS — J309 Allergic rhinitis, unspecified: Secondary | ICD-10-CM | POA: Diagnosis not present

## 2015-12-28 MED ORDER — RANITIDINE HCL 300 MG PO TABS: 300.0000 mg | ORAL_TABLET | Freq: Every day | ORAL | Status: DC

## 2015-12-28 MED ORDER — MONTELUKAST SODIUM 10 MG PO TABS: 10.0000 mg | ORAL_TABLET | Freq: Every day | ORAL | Status: DC

## 2015-12-28 NOTE — Patient Instructions (Addendum)
  1. Allergen avoidance measures  2. Treat inflammation: Montelukast 10 mg one tablet one time per day  3. Treat reflux:   A. consolidate all caffeine use over the next 4 weeks  B. continue omeprazole 40 mg in a.m.  C. start ranitidine 300 mg in PM  4. If needed: OTC loratadine / cetirizine 10mg  one time per day  5. Return to clinic in 8 weeks or earlier if problem

## 2015-12-28 NOTE — Progress Notes (Signed)
[facility]    Dear Dr. Chapman Fitch,  Thank you for referring [name] [name] to the [facility] on 12/28/2015.   Below is a summation of this patient's evaluation and recommendations.  Thank you for your referral. I will keep you informed about this patient's response to treatment.   If you have any questions please to do hestitate to contact me.   Sincerely,  Jiles Prows, MD [facility]   ______________________________________________________________________    NEW PATIENT NOTE  Referring Provider: Antony Blackbird, MD Primary Provider: Antony Blackbird, MD Date of office visit: 12/28/2015    Subjective:   Chief Complaint:  [name] [name] is a 50 y.o. female with a chief complaint of Gastroesophageal Reflux; Allergies; and Sinusitis  who presents to the clinic on 12/28/2015 with the following problems:  HPI Comments: [name] [name] presents this clinic in evaluation of respiratory tract symptoms. Apparently she carries the diagnosis of laryngopharyngeal reflux followed by Dr. Ernesto Rutherford presenting as a "swollen throat" feeling especially whenever she exercises. She basically feels as though she's been screaming for a prolonged period in time. She has a scratchy voice and throat clearing as well. Her reflux appears to be under pretty good control while using Prilosec 40 mg in the morning and sometimes at nighttime but she still continues to have these complaints. She drinks about 3 large mugs of coffee every morning and an additional coffee in the afternoon and a green tea in the afternoon. She does not consume any chocolate. She has ethanol a few times a week. In addition, she does have minimal nasal symptoms and watery eyes on occasion without any obvious trigger. She does not have any associated anosmia or ugly nasal discharge. She does have  migraine headaches.   Past Medical History  Diagnosis Date  . Migraine   . GERD (gastroesophageal reflux disease)   . Anemia   . Blood transfusion without reported diagnosis   . Kidney stones     Past Surgical History  Procedure Laterality Date  . Uterine fibroid surgery    . Splenectomy    . Tubal ligation      Current outpatient prescriptions:  .  buPROPion (WELLBUTRIN XL) 300 MG 24 hr tablet, Take 300 mg by mouth daily., Disp: , Rfl:  .  omeprazole (PRILOSEC) 40 MG capsule, Take 40 mg by mouth daily., Disp: , Rfl:  .  SUMAtriptan (IMITREX) 100 MG tablet, Take 50 mg by mouth every 2 (two) hours as needed. For migraine, Disp: , Rfl:    Allergies  Allergen Reactions  . Grapefruit Extract     Results of an allergy test  . Vicodin [Hydrocodone-Acetaminophen] Swelling    rash  . Penicillins Swelling and Rash    Review of systems negative except as noted in HPI / PMHx or noted below:  Review of Systems  Constitutional: Negative.   HENT: Negative.   Eyes: Negative.   Respiratory: Negative.   Cardiovascular: Negative.   Gastrointestinal: Negative.   Genitourinary: Negative.   Musculoskeletal: Negative.   Skin: Negative.   Neurological: Negative.   Endo/Heme/Allergies: Negative.   Psychiatric/Behavioral: Negative.     Family History  Problem Relation Age of Onset  . Lung cancer Maternal Grandmother   . Lung cancer Maternal Uncle   . Colon cancer Maternal Uncle     Social History   Social History  . Marital  Status: Divorced    Spouse Name: N/A  . Number of Children: N/A  . Years of Education: N/A   Occupational History  . Editior    Social History Main Topics  . Smoking status: Never Smoker   . Smokeless tobacco: Never Used  . Alcohol Use: Yes     Comment: OCC.  . Drug Use: No  . Sexual Activity: Not on file   Other Topics Concern  . Not on file   Social History Narrative    Environmental and Social history  Lives in a house with a dry  environment, cats and dogs located inside the household, carpeting in the bedroom, no plastic on the bed or pillow, and no smokers located inside the household. 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   Objective:   Filed Vitals:   12/28/15 0824  BP: 100/68  Pulse: 76  Temp: 99 F (37.2 C)  Resp: 12   Height: 5' 4.96" (165 cm) Weight: 160 lb 15 oz (73 kg)  Physical Exam  Constitutional: She is well-developed, well-nourished, and in no distress.  HENT:  Head: Normocephalic. Head is without right periorbital erythema and without left periorbital erythema.  Right Ear: Tympanic membrane, external ear and ear canal normal.  Left Ear: Tympanic membrane, external ear and ear canal normal.  Nose: Nose normal. No mucosal edema or rhinorrhea.  Mouth/Throat: Oropharynx is clear and moist and mucous membranes are normal. No oropharyngeal exudate.  Eyes: Conjunctivae and lids are normal. Pupils are equal, round, and reactive to light.  Neck: Trachea normal. No tracheal deviation present. No thyromegaly present.  Cardiovascular: Normal rate, regular rhythm, S1 normal, S2 normal and normal heart sounds.   No murmur heard. Pulmonary/Chest: Effort normal. No stridor. No tachypnea. No respiratory distress. She has no wheezes. She has no rales. She exhibits no tenderness.  Abdominal: Soft. She exhibits no distension and no mass. There is no hepatosplenomegaly. There is no tenderness. There is no rebound and no guarding.  Musculoskeletal: She exhibits no edema or tenderness.  Lymphadenopathy:       Head (right side): No tonsillar adenopathy present.       Head (left side): No tonsillar adenopathy present.    She has no cervical adenopathy.    She has no axillary adenopathy.  Neurological: She is alert. Gait normal.  Skin: No rash noted. She is not diaphoretic. No erythema. No pallor. Nails show no clubbing.  Psychiatric: Mood and affect normal.     Diagnostics: Allergy skin tests were  performed. She demonstrated hypersensitivity to house dust mite  Assessment and Plan:    1. Allergic rhinoconjunctivitis   2. LPRD (laryngopharyngeal reflux disease)     1. Allergen avoidance measures  2. Treat inflammation: Montelukast 10 mg one tablet one time per day  3. Treat reflux:   A. consolidate all caffeine use over the next 4 weeks  B. continue omeprazole 40 mg in a.m.  C. start ranitidine 300 mg in PM  4. If needed: OTC loratadine / cetirizine 10mg  one time per day  5. Return to clinic in 8 weeks or earlier if problem  Anastaysia should do better with attention to reflux treatment including a rather significant consolidation of her caffeine use over the course of the next 4 weeks in addition to a combination of omeprazole and ranitidine. I've also given her montelukast and allergen avoidance measures instructions for her atopic respiratory and eye disease and she has the option of continuing on an antihistamine.  We'll see how things go over the course of the next 8 weeks.

## 2016-02-22 ENCOUNTER — Ambulatory Visit: Payer: Commercial Managed Care - PPO | Admitting: Allergy and Immunology

## 2016-03-21 ENCOUNTER — Ambulatory Visit: Payer: Commercial Managed Care - PPO | Admitting: Allergy and Immunology

## 2016-04-19 ENCOUNTER — Other Ambulatory Visit: Payer: Self-pay

## 2016-04-19 ENCOUNTER — Other Ambulatory Visit: Payer: Self-pay | Admitting: Family Medicine

## 2016-04-19 ENCOUNTER — Encounter: Payer: Self-pay | Admitting: Internal Medicine

## 2016-04-19 DIAGNOSIS — E2839 Other primary ovarian failure: Secondary | ICD-10-CM

## 2016-04-19 DIAGNOSIS — Z1231 Encounter for screening mammogram for malignant neoplasm of breast: Secondary | ICD-10-CM

## 2016-04-27 ENCOUNTER — Ambulatory Visit: Payer: Commercial Managed Care - PPO

## 2016-05-02 ENCOUNTER — Ambulatory Visit: Payer: Commercial Managed Care - PPO

## 2016-05-02 ENCOUNTER — Inpatient Hospital Stay
Admission: RE | Admit: 2016-05-02 | Discharge: 2016-05-02 | Disposition: A | Discharge status: Home / Self Care | Payer: Commercial Managed Care - PPO | Source: Ambulatory Visit | Attending: Family Medicine | Admitting: Family Medicine

## 2016-05-31 ENCOUNTER — Ambulatory Visit (AMBULATORY_SURGERY_CENTER): Payer: Self-pay

## 2016-05-31 VITALS — Ht 66.0 in | Wt 168.2 lb

## 2016-05-31 DIAGNOSIS — Z800 Family history of malignant neoplasm of digestive organs: Secondary | ICD-10-CM

## 2016-05-31 MED ORDER — SUPREP BOWEL PREP KIT 17.5-3.13-1.6 GM/177ML PO SOLN: 1.0000 | Freq: Once | ORAL | Status: DC

## 2016-05-31 NOTE — Progress Notes (Signed)
No allergies to eggs or soy No diet meds No past problems with anesthesia No home oxygen  Has email and internet; registered for emmi

## 2016-06-05 ENCOUNTER — Encounter: Payer: Self-pay | Admitting: Internal Medicine

## 2016-06-14 ENCOUNTER — Encounter: Payer: Self-pay | Admitting: Internal Medicine

## 2016-06-14 ENCOUNTER — Ambulatory Visit (AMBULATORY_SURGERY_CENTER): Payer: Commercial Managed Care - PPO | Admitting: Internal Medicine

## 2016-06-14 VITALS — BP 100/64 | HR 69 | Temp 98.2°F | Resp 18 | Ht 66.0 in | Wt 168.0 lb

## 2016-06-14 DIAGNOSIS — Z1211 Encounter for screening for malignant neoplasm of colon: Secondary | ICD-10-CM

## 2016-06-14 DIAGNOSIS — Z800 Family history of malignant neoplasm of digestive organs: Secondary | ICD-10-CM

## 2016-06-14 DIAGNOSIS — D125 Benign neoplasm of sigmoid colon: Secondary | ICD-10-CM | POA: Diagnosis not present

## 2016-06-14 DIAGNOSIS — D120 Benign neoplasm of cecum: Secondary | ICD-10-CM | POA: Diagnosis not present

## 2016-06-14 HISTORY — PX: COLONOSCOPY: SHX174

## 2016-06-14 MED ORDER — SODIUM CHLORIDE 0.9 % IV SOLN: 500.0000 mL | INTRAVENOUS | Status: DC

## 2016-06-14 NOTE — Progress Notes (Signed)
Report to PACU, RN, vss, BBS= Clear.  

## 2016-06-14 NOTE — Patient Instructions (Signed)
Impressions/recommendations:  Polyps (handout given) Diverticulosis (handout given) High Fiber diet (handout given) Hemorrhoids (handout given)  YOU HAD AN ENDOSCOPIC PROCEDURE TODAY AT [facility]:   Refer to the procedure report that was given to you for any specific questions about what was found during the examination.  If the procedure report does not answer your questions, please call your gastroenterologist to clarify.  If you requested that your care partner not be given the details of your procedure findings, then the procedure report has been included in a sealed envelope for you to review at your convenience later.  YOU SHOULD EXPECT: Some feelings of bloating in the abdomen. Passage of more gas than usual.  Walking can help get rid of the air that was put into your GI tract during the procedure and reduce the bloating. If you had a lower endoscopy (such as a colonoscopy or flexible sigmoidoscopy) you may notice spotting of blood in your stool or on the toilet paper. If you underwent a bowel prep for your procedure, you may not have a normal bowel movement for a few days.  Please Note:  You might notice some irritation and congestion in your nose or some drainage.  This is from the oxygen used during your procedure.  There is no need for concern and it should clear up in a day or so.  SYMPTOMS TO REPORT IMMEDIATELY:   Following lower endoscopy (colonoscopy or flexible sigmoidoscopy):  Excessive amounts of blood in the stool  Significant tenderness or worsening of abdominal pains  Swelling of the abdomen that is new, acute  Fever of 100F or higher   For urgent or emergent issues, a gastroenterologist can be reached at any hour by calling [phone].   DIET: Your first meal following the procedure should be a small meal and then it is ok to progress to your normal diet. Heavy or fried foods are harder to digest and may make you feel nauseous or bloated.   Likewise, meals heavy in dairy and vegetables can increase bloating.  Drink plenty of fluids but you should avoid alcoholic beverages for 24 hours.  ACTIVITY:  You should plan to take it easy for the rest of today and you should NOT DRIVE or use heavy machinery until tomorrow (because of the sedation medicines used during the test).    FOLLOW UP: Our staff will call the number listed on your records the next business day following your procedure to check on you and address any questions or concerns that you may have regarding the information given to you following your procedure. If we do not reach you, we will leave a message.  However, if you are feeling well and you are not experiencing any problems, there is no need to return our call.  We will assume that you have returned to your regular daily activities without incident.  If any biopsies were taken you will be contacted by phone or by letter within the next 1-3 weeks.  Please call us at [phone] if you have not heard about the biopsies in 3 weeks.    SIGNATURES/CONFIDENTIALITY: You and/or your care partner have signed paperwork which will be entered into your electronic medical record.  These signatures attest to the fact that that the information above on your After Visit Summary has been reviewed and is understood.  Full responsibility of the confidentiality of this discharge information lies with you and/or your care-partner.

## 2016-06-14 NOTE — Op Note (Signed)
[facility] Patient Name: [name] [name] Procedure Date: 06/14/2016 10:40 AM MRN: [id number] Endoscopist: Docia Chuck. Henrene Pastor , MD Age: 50 Referring MD:  Date of Birth: [birth year] Gender: Female Account #: [id number] Procedure:                Colonoscopy, with cold snare polypectomy X4 Indications:              Screening for colorectal malignant neoplasm Medicines:                Monitored Anesthesia Care Procedure:                Pre-Anesthesia Assessment:                           - Prior to the procedure, a History and Physical                            was performed, and patient medications and                            allergies were reviewed. The patient's tolerance of                            previous anesthesia was also reviewed. The risks                            and benefits of the procedure and the sedation                            options and risks were discussed with the patient.                            All questions were answered, and informed consent                            was obtained. Prior Anticoagulants: The patient has                            taken no previous anticoagulant or antiplatelet                            agents. ASA Grade Assessment: II - A patient with                            mild systemic disease. After reviewing the risks                            and benefits, the patient was deemed in                            satisfactory condition to undergo the procedure.                           After obtaining informed consent, the colonoscope  was passed under direct vision. Throughout the                            procedure, the patient's blood pressure, pulse, and                            oxygen saturations were monitored continuously. The                            Model CF-HQ190L [id number]) scope was introduced                            through the anus and advanced to the the cecum,                   identified by appendiceal orifice and ileocecal                            valve. The ileocecal valve, appendiceal orifice,                            and rectum were photographed. The quality of the                            bowel preparation was excellent. The colonoscopy                            was performed without difficulty. The patient                            tolerated the procedure well. The bowel preparation                            used was SUPREP. Scope In: 10:47:34 AM Scope Out: [id number] AM Scope Withdrawal Time: 0 hours 15 minutes 31 seconds  Total Procedure Duration: 0 hours 19 minutes 7 seconds  Findings:                 Four polyps were found in the sigmoid colon and                            cecum. The polyps were 1 to 5 mm in size. These                            polyps were removed with a cold snare. Resection                            and retrieval were complete.                           A few small-mouthed diverticula were found in the                            sigmoid colon.  Internal hemorrhoids were found during retroflexion.                           The exam was otherwise without abnormality on                            direct and retroflexion views. Complications:            No immediate complications. Estimated blood loss:                            None. Estimated Blood Loss:     Estimated blood loss: none. Impression:               - Four 1 to 5 mm polyps in the sigmoid colon and in                            the cecum, removed with a cold snare. Resected and                            retrieved.                           - Diverticulosis in the sigmoid colon.                           - Internal hemorrhoids.                           - The examination was otherwise normal on direct                            and retroflexion views. Recommendation:           - Repeat colonoscopy in 5 years for  surveillance.                           - Resume previous diet.                           - Continue present medications.                           - Await pathology results. Docia Chuck. Henrene Pastor, MD 06/14/2016 11:14:40 AM This report has been signed electronically. CC Letter to:             Derinda Late

## 2016-06-14 NOTE — Progress Notes (Signed)
Called to room to assist during endoscopic procedure.  Patient ID and intended procedure confirmed with present staff. Received instructions for my participation in the procedure from the performing physician.  

## 2016-06-15 ENCOUNTER — Telehealth: Payer: Self-pay | Admitting: *Deleted

## 2016-06-15 NOTE — Telephone Encounter (Signed)
  Follow up Call-  Call back number 06/14/2016 05/07/2014  Post procedure Call Back phone  # [phone]  Permission to leave phone message Yes Yes     Patient questions:  Do you have a fever, pain , or abdominal swelling? No. Pain Score  0 *  Have you tolerated food without any problems? Yes.    Have you been able to return to your normal activities? Yes.    Do you have any questions about your discharge instructions: Diet   No. Medications  No. Follow up visit  No.  Do you have questions or concerns about your Care? No.  Actions: * If pain score is 4 or above: No action needed, pain <4.

## 2016-06-21 ENCOUNTER — Encounter: Payer: Self-pay | Admitting: Internal Medicine

## 2016-07-19 ENCOUNTER — Other Ambulatory Visit: Payer: Self-pay | Admitting: Family Medicine

## 2017-01-15 ENCOUNTER — Other Ambulatory Visit: Payer: Self-pay | Admitting: Family Medicine

## 2017-01-15 ENCOUNTER — Ambulatory Visit
Admission: RE | Admit: 2017-01-15 | Discharge: 2017-01-15 | Disposition: A | Discharge status: Home / Self Care | Payer: Commercial Managed Care - PPO | Source: Ambulatory Visit | Attending: Family Medicine | Admitting: Family Medicine

## 2017-01-15 DIAGNOSIS — S92515A Nondisplaced fracture of proximal phalanx of left lesser toe(s), initial encounter for closed fracture: Secondary | ICD-10-CM | POA: Diagnosis not present

## 2017-01-15 DIAGNOSIS — M79675 Pain in left toe(s): Secondary | ICD-10-CM

## 2017-02-19 DIAGNOSIS — M7710 Lateral epicondylitis, unspecified elbow: Secondary | ICD-10-CM | POA: Diagnosis not present

## 2017-02-19 DIAGNOSIS — S92505D Nondisplaced unspecified fracture of left lesser toe(s), subsequent encounter for fracture with routine healing: Secondary | ICD-10-CM | POA: Diagnosis not present

## 2017-06-11 DIAGNOSIS — M7712 Lateral epicondylitis, left elbow: Secondary | ICD-10-CM | POA: Diagnosis not present

## 2017-06-11 DIAGNOSIS — G43909 Migraine, unspecified, not intractable, without status migrainosus: Secondary | ICD-10-CM | POA: Diagnosis not present

## 2017-07-04 IMAGING — CR DG FOOT COMPLETE 3+V*L*
3 series · 3 of 3 positions shown · non-contrast
Comparison: None.

CLINICAL DATA: Pain and swelling in the lateral forefoot after
injury last night. Question fifth metatarsal fracture.

EXAM:
LEFT FOOT - COMPLETE 3+ VIEW

[x foot ap left]
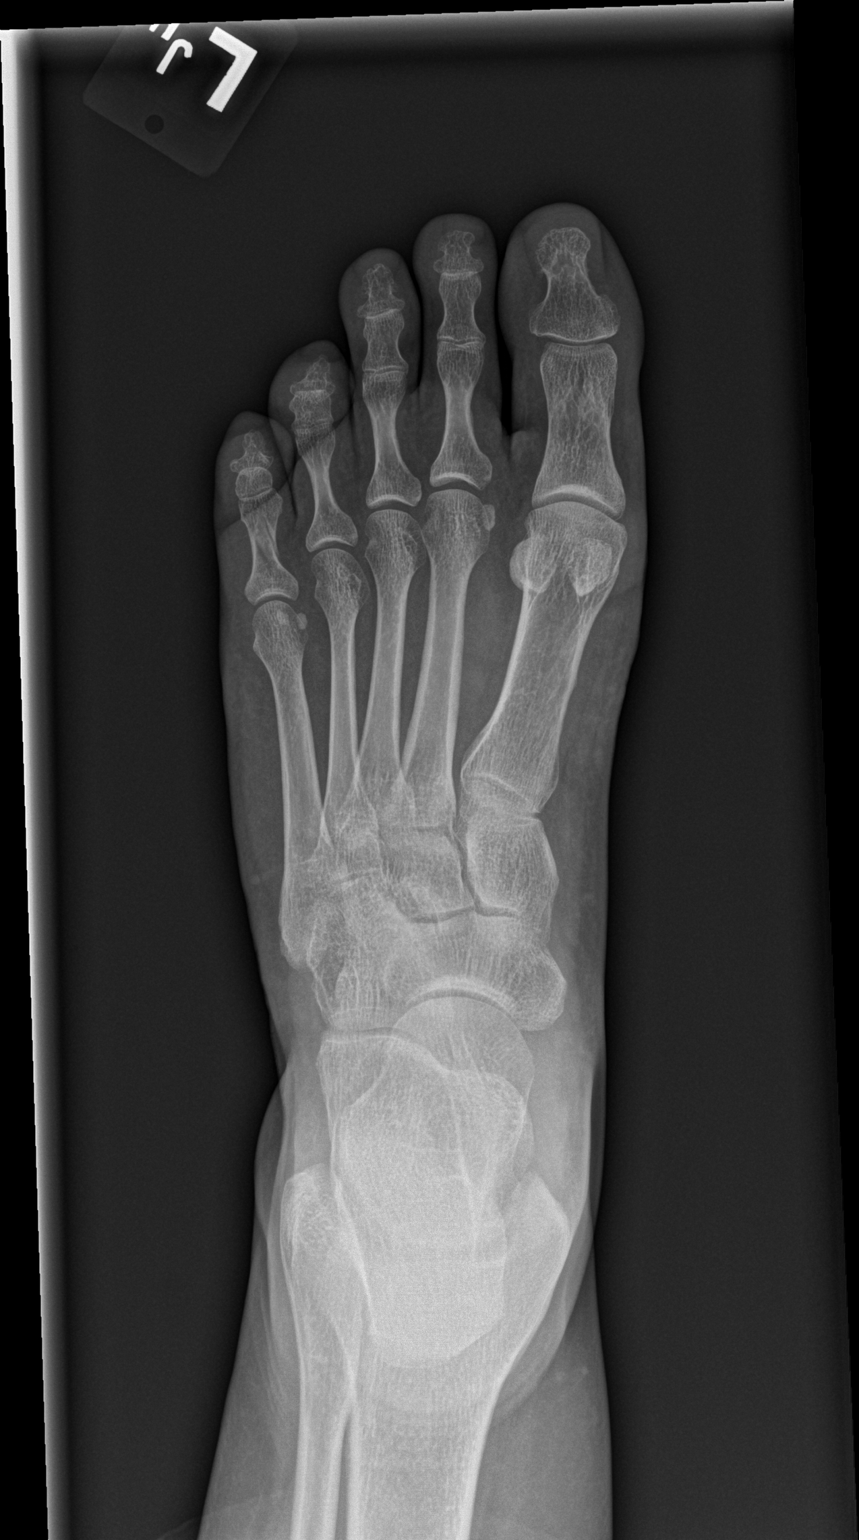

[x foot obl left]
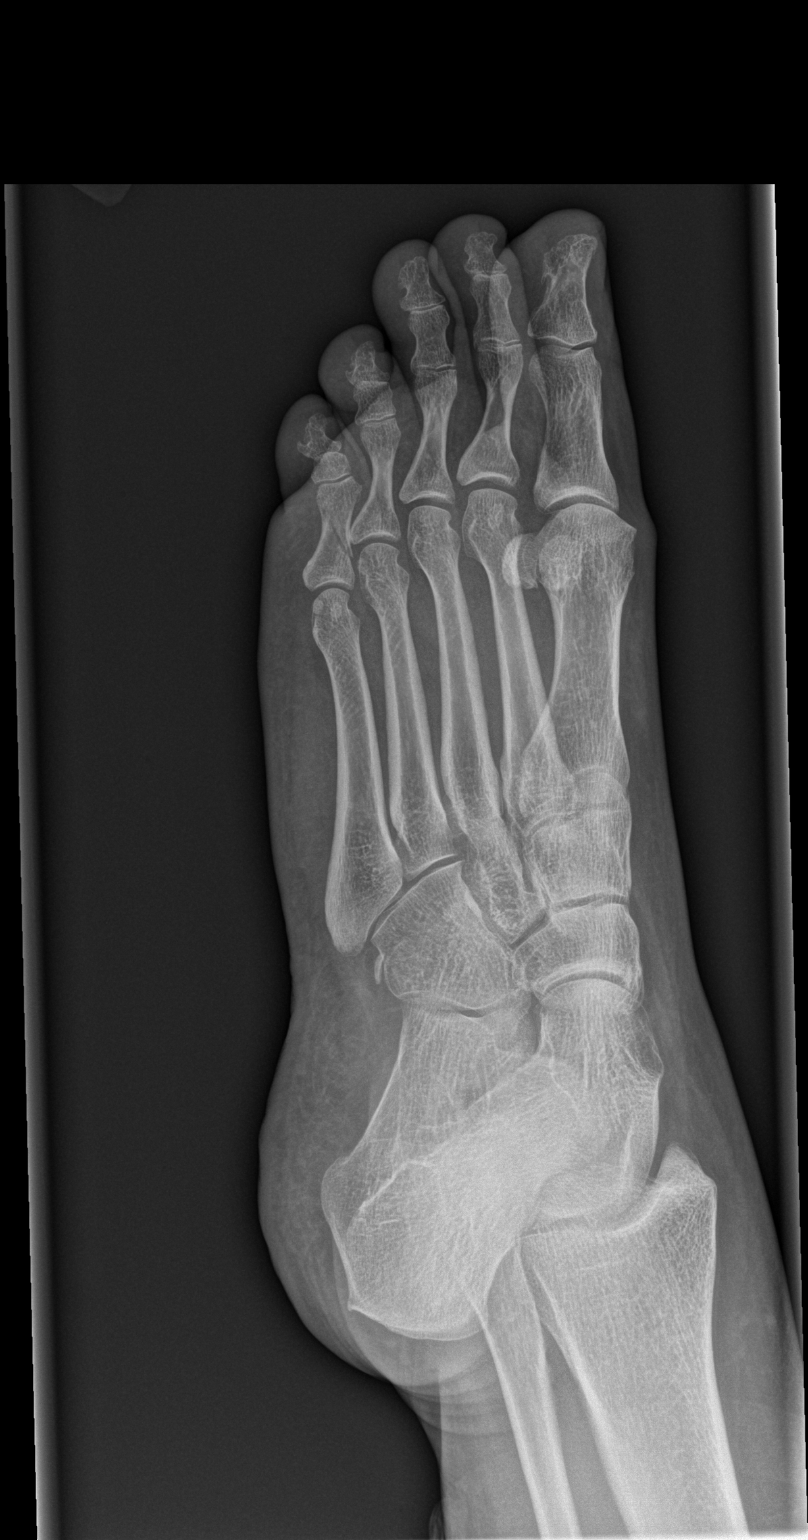

[x foot lat left]
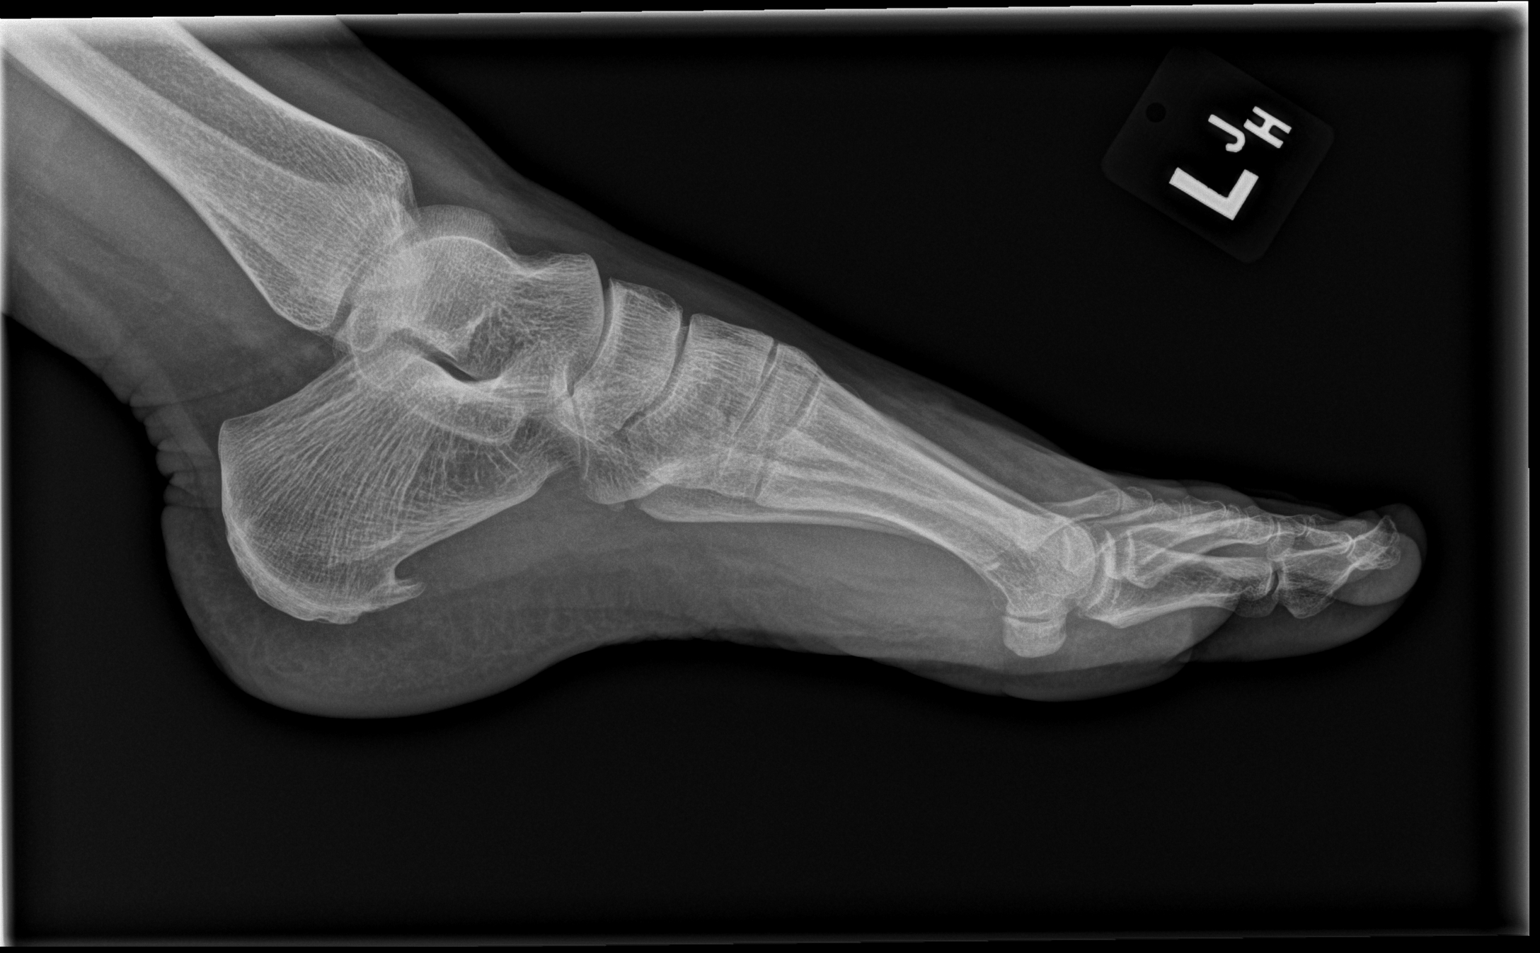

[3 of 3 positions shown; findings below may reference images not displayed]

FINDINGS: There is an oblique nondisplaced fracture involving the fifth
proximal phalanx. This demonstrates no definite intra-articular
extension. No other fractures are seen. The fifth metatarsal appears
intact. The alignment is normal at the Lisfranc joint. Mild forefoot
soft tissue swelling and small plantar calcaneal spur are noted.
IMPRESSION: Nondisplaced oblique fracture of the fifth proximal phalanx.

## 2017-09-06 DIAGNOSIS — Z0000 Encounter for general adult medical examination without abnormal findings: Secondary | ICD-10-CM | POA: Diagnosis not present

## 2017-09-10 ENCOUNTER — Other Ambulatory Visit: Payer: Self-pay | Admitting: Family Medicine

## 2017-09-10 DIAGNOSIS — Z1211 Encounter for screening for malignant neoplasm of colon: Secondary | ICD-10-CM | POA: Diagnosis not present

## 2017-09-10 DIAGNOSIS — Z1231 Encounter for screening mammogram for malignant neoplasm of breast: Secondary | ICD-10-CM

## 2017-09-10 DIAGNOSIS — Z0000 Encounter for general adult medical examination without abnormal findings: Secondary | ICD-10-CM | POA: Diagnosis not present

## 2017-09-10 DIAGNOSIS — M79676 Pain in unspecified toe(s): Secondary | ICD-10-CM | POA: Diagnosis not present

## 2017-09-10 DIAGNOSIS — H6121 Impacted cerumen, right ear: Secondary | ICD-10-CM | POA: Diagnosis not present

## 2017-09-10 DIAGNOSIS — K219 Gastro-esophageal reflux disease without esophagitis: Secondary | ICD-10-CM | POA: Diagnosis not present

## 2017-09-10 DIAGNOSIS — Z23 Encounter for immunization: Secondary | ICD-10-CM | POA: Diagnosis not present

## 2017-09-26 ENCOUNTER — Ambulatory Visit: Payer: Commercial Managed Care - PPO

## 2017-10-25 DIAGNOSIS — Z1231 Encounter for screening mammogram for malignant neoplasm of breast: Secondary | ICD-10-CM | POA: Diagnosis not present

## 2017-11-07 DIAGNOSIS — K219 Gastro-esophageal reflux disease without esophagitis: Secondary | ICD-10-CM | POA: Diagnosis not present

## 2017-11-07 DIAGNOSIS — N951 Menopausal and female climacteric states: Secondary | ICD-10-CM | POA: Diagnosis not present

## 2017-11-07 DIAGNOSIS — G43909 Migraine, unspecified, not intractable, without status migrainosus: Secondary | ICD-10-CM | POA: Diagnosis not present

## 2017-12-12 DIAGNOSIS — G43909 Migraine, unspecified, not intractable, without status migrainosus: Secondary | ICD-10-CM | POA: Diagnosis not present

## 2017-12-12 DIAGNOSIS — K219 Gastro-esophageal reflux disease without esophagitis: Secondary | ICD-10-CM | POA: Diagnosis not present

## 2018-01-23 DIAGNOSIS — M9902 Segmental and somatic dysfunction of thoracic region: Secondary | ICD-10-CM | POA: Diagnosis not present

## 2018-01-23 DIAGNOSIS — G44209 Tension-type headache, unspecified, not intractable: Secondary | ICD-10-CM | POA: Diagnosis not present

## 2018-01-23 DIAGNOSIS — M9901 Segmental and somatic dysfunction of cervical region: Secondary | ICD-10-CM | POA: Diagnosis not present

## 2018-01-25 DIAGNOSIS — M9901 Segmental and somatic dysfunction of cervical region: Secondary | ICD-10-CM | POA: Diagnosis not present

## 2018-01-25 DIAGNOSIS — G44209 Tension-type headache, unspecified, not intractable: Secondary | ICD-10-CM | POA: Diagnosis not present

## 2018-01-25 DIAGNOSIS — M9902 Segmental and somatic dysfunction of thoracic region: Secondary | ICD-10-CM | POA: Diagnosis not present

## 2018-01-28 DIAGNOSIS — G44209 Tension-type headache, unspecified, not intractable: Secondary | ICD-10-CM | POA: Diagnosis not present

## 2018-01-28 DIAGNOSIS — M9902 Segmental and somatic dysfunction of thoracic region: Secondary | ICD-10-CM | POA: Diagnosis not present

## 2018-01-28 DIAGNOSIS — M9901 Segmental and somatic dysfunction of cervical region: Secondary | ICD-10-CM | POA: Diagnosis not present

## 2018-01-31 DIAGNOSIS — M9901 Segmental and somatic dysfunction of cervical region: Secondary | ICD-10-CM | POA: Diagnosis not present

## 2018-01-31 DIAGNOSIS — M9902 Segmental and somatic dysfunction of thoracic region: Secondary | ICD-10-CM | POA: Diagnosis not present

## 2018-01-31 DIAGNOSIS — G44209 Tension-type headache, unspecified, not intractable: Secondary | ICD-10-CM | POA: Diagnosis not present

## 2018-02-06 DIAGNOSIS — M9901 Segmental and somatic dysfunction of cervical region: Secondary | ICD-10-CM | POA: Diagnosis not present

## 2018-02-06 DIAGNOSIS — M9902 Segmental and somatic dysfunction of thoracic region: Secondary | ICD-10-CM | POA: Diagnosis not present

## 2018-02-06 DIAGNOSIS — G44209 Tension-type headache, unspecified, not intractable: Secondary | ICD-10-CM | POA: Diagnosis not present

## 2018-02-15 DIAGNOSIS — M9901 Segmental and somatic dysfunction of cervical region: Secondary | ICD-10-CM | POA: Diagnosis not present

## 2018-02-15 DIAGNOSIS — G44209 Tension-type headache, unspecified, not intractable: Secondary | ICD-10-CM | POA: Diagnosis not present

## 2018-02-15 DIAGNOSIS — M9902 Segmental and somatic dysfunction of thoracic region: Secondary | ICD-10-CM | POA: Diagnosis not present

## 2018-03-25 DIAGNOSIS — Z23 Encounter for immunization: Secondary | ICD-10-CM | POA: Diagnosis not present

## 2018-03-25 DIAGNOSIS — K219 Gastro-esophageal reflux disease without esophagitis: Secondary | ICD-10-CM | POA: Diagnosis not present

## 2018-03-29 DIAGNOSIS — G43909 Migraine, unspecified, not intractable, without status migrainosus: Secondary | ICD-10-CM | POA: Diagnosis not present

## 2018-03-29 DIAGNOSIS — K219 Gastro-esophageal reflux disease without esophagitis: Secondary | ICD-10-CM | POA: Diagnosis not present

## 2018-06-08 ENCOUNTER — Other Ambulatory Visit: Payer: Self-pay

## 2018-06-08 ENCOUNTER — Emergency Department (HOSPITAL_COMMUNITY)
Admission: EM | Admit: 2018-06-08 | Discharge: 2018-06-09 | Disposition: A | Discharge status: Home / Self Care | Payer: Commercial Managed Care - PPO | Attending: Emergency Medicine | Admitting: Emergency Medicine

## 2018-06-08 ENCOUNTER — Encounter (HOSPITAL_COMMUNITY): Payer: Self-pay | Admitting: Emergency Medicine

## 2018-06-08 DIAGNOSIS — Z79899 Other long term (current) drug therapy: Secondary | ICD-10-CM | POA: Diagnosis not present

## 2018-06-08 DIAGNOSIS — Z046 Encounter for general psychiatric examination, requested by authority: Secondary | ICD-10-CM | POA: Diagnosis present

## 2018-06-08 DIAGNOSIS — R45851 Suicidal ideations: Secondary | ICD-10-CM | POA: Insufficient documentation

## 2018-06-08 DIAGNOSIS — J45909 Unspecified asthma, uncomplicated: Secondary | ICD-10-CM | POA: Diagnosis not present

## 2018-06-08 DIAGNOSIS — F331 Major depressive disorder, recurrent, moderate: Secondary | ICD-10-CM | POA: Insufficient documentation

## 2018-06-08 LAB — CBC
HEMATOCRIT: 43.7 % (ref 36.0–46.0)
HEMOGLOBIN: 14.3 g/dL (ref 12.0–15.0)
MCH: 30.8 pg (ref 26.0–34.0)
MCHC: 32.7 g/dL (ref 30.0–36.0)
MCV: 94 fL (ref 78.0–100.0)
PLATELETS: 389 10*3/uL (ref 150–400)
RBC: 4.65 MIL/uL (ref 3.87–5.11)
RDW: 13.2 % (ref 11.5–15.5)
WBC: 11.2 10*3/uL — AB (ref 4.0–10.5)

## 2018-06-08 LAB — COMPREHENSIVE METABOLIC PANEL
ALBUMIN: 4.2 g/dL (ref 3.5–5.0)
ALK PHOS: 94 U/L (ref 38–126)
ALT: 13 U/L (ref 0–44)
ANION GAP: 12 (ref 5–15)
AST: 20 U/L (ref 15–41)
BUN: 10 mg/dL (ref 6–20)
CO2: 24 mmol/L (ref 22–32)
Calcium: 10.1 mg/dL (ref 8.9–10.3)
Chloride: 106 mmol/L (ref 98–111)
Creatinine, Ser: 0.77 mg/dL (ref 0.44–1.00)
GFR calc Af Amer: >60 mL/min (ref >60)
GFR calc non Af Amer: >60 mL/min (ref >60)
GLUCOSE: 110 mg/dL — AB (ref 70–99)
POTASSIUM: 3.4 mmol/L — AB (ref 3.5–5.1)
SODIUM: 142 mmol/L (ref 135–145)
TOTAL PROTEIN: 8.1 g/dL (ref 6.5–8.1)
Total Bilirubin: 0.6 mg/dL (ref 0.3–1.2)

## 2018-06-08 LAB — I-STAT BETA HCG BLOOD, ED (MC, WL, AP ONLY)
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
## 2018-06-09 LAB — RAPID URINE DRUG SCREEN, HOSP PERFORMED
AMPHETAMINES: NOT DETECTED
Benzodiazepines: NOT DETECTED
Cocaine: NOT DETECTED
Opiates: NOT DETECTED
TETRAHYDROCANNABINOL: NOT DETECTED

## 2018-06-09 LAB — ACETAMINOPHEN LEVEL: Acetaminophen (Tylenol), Serum: <10 ug/mL — ABNORMAL LOW (ref 10–30)

## 2018-06-09 LAB — ETHANOL: ALCOHOL ETHYL (B): 17 mg/dL — AB (ref <10)

## 2018-06-09 LAB — SALICYLATE LEVEL: Salicylate Lvl: <7 mg/dL (ref 2.8–30.0)

## 2018-06-09 NOTE — ED Notes (Signed)
TTS completed. 

## 2018-06-09 NOTE — Progress Notes (Signed)
Patient is seen by me via tele-psych and I have consulted with Dr. Dwyane Dee.  Patient denies any SI/HI/AVH and contracts for safety.  Patient is future oriented and is concerned about kittens that she is taking care of with the [employer] at her house.  Patient reports that she feels that there was a miscommunication with the mobile crisis center.  Patient does not meet inpatient criteria and is psychiatrically cleared.  Patient is very interested and [facility] outpatient services and have requested CSW to provide patient with [facility] outpatient information for psychiatry.  I attempted to call Dr. Wilson Singer, but physician was on the phone and I notified the charge nurse.

## 2018-06-09 NOTE — ED Notes (Signed)
Breakfast tray ordered 
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
## 2018-06-09 NOTE — ED Notes (Signed)
ALL belongings - 2 labeled belongings bag and 1 valuables envelope - returned to pt - Pt signed verifying all items present. Pt voiced understanding to follow up w/recommendation from [facility] w/Outpt Intensive resource. SI resources given.

## 2018-06-09 NOTE — ED Notes (Signed)
Pt's female friend who is visiting - escorted to lobby while pt's Telepsych being performed.

## 2018-06-09 NOTE — ED Notes (Signed)
Pt's friend has left at this time.

## 2018-06-09 NOTE — ED Notes (Signed)
Patient denies pain and is resting comfortably.  

## 2018-06-09 NOTE — ED Notes (Signed)
Pt aware of tx plan and [facility] recommendations - d/c to home - waiting on EDP to perform paperwork and rescind IVC. Pt requesting to have caff'd beverage d/t states "I get migraines w/o it and I feel one coming on". Offered pt Tylenol or Motrin - declined - requesting Caff Coke - given.

## 2018-06-09 NOTE — ED Notes (Signed)
IVC papers rescinded by Dr Wilson Singer - copy faxed to [facility], copy sent to Medical Records, and original placed in folder for [employer].

## 2018-06-09 NOTE — ED Notes (Signed)
IVC papers faxed to [facility].  

## 2018-06-09 NOTE — ED Notes (Addendum)
Pt aware of tx plan - pt noted to be upset d/t states she is supposed to meet a co-worker friend at 18 this am and is afraid she will not make it in time and she has no way of reaching her d/t when [facility] brought her to ED was advised to leave her cell phone at home. Offered for pt to call someone else who may have her number. Pt states she doesn't know anyone. Pt then stated she needs to call someone to care for her 67-week-old cats and maybe this person can look in her cell phone for her friend's number.Pt left a message for someone on phone. 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 Pt noted to be alert, calm, pleasant. States she slept approx 3 hours last pm. States she does not have tv at home d/t she reads and writes - states "so it was interesting to watch tv". States she called Suicide Hotline d/t only wanted resources.

## 2018-06-09 NOTE — ED Notes (Signed)
Pt appears to be anxious - states she needs to talk w/[facility] so may be d/c'd. States she was advised by night [facility] Counselor that she would be seen at 0800 this am by provider and would be able to be d/c'd to home. States she is worried about her 6-wk-old kittens. Advised pt of tx plan and that [facility] is aware of her needing to be re-assessed/Telepsych. Offered for her to call someone - states "That's why I'm here. I don't have anyone to call". Suggested for her to call the friend whom she called earlier to go to her home and retrieve numbers out of her cell phone and request he take care of kittens. States she will and requested for him to visit at 1230 so she can explain how to prepare their food.

## 2018-06-09 NOTE — ED Notes (Signed)
Spoke with MD about needing 1st exam for patient. MD states that he will work on it.

## 2018-07-22 DIAGNOSIS — S8011XA Contusion of right lower leg, initial encounter: Secondary | ICD-10-CM | POA: Diagnosis not present

## 2018-07-22 DIAGNOSIS — S060X9S Concussion with loss of consciousness of unspecified duration, sequela: Secondary | ICD-10-CM | POA: Diagnosis not present

## 2018-07-22 DIAGNOSIS — M549 Dorsalgia, unspecified: Secondary | ICD-10-CM | POA: Diagnosis not present

## 2018-07-26 DIAGNOSIS — S060X9S Concussion with loss of consciousness of unspecified duration, sequela: Secondary | ICD-10-CM | POA: Diagnosis not present

## 2018-07-26 DIAGNOSIS — R51 Headache: Secondary | ICD-10-CM | POA: Diagnosis not present

## 2018-07-26 DIAGNOSIS — S134XXD Sprain of ligaments of cervical spine, subsequent encounter: Secondary | ICD-10-CM | POA: Diagnosis not present

## 2018-08-01 DIAGNOSIS — S060X9S Concussion with loss of consciousness of unspecified duration, sequela: Secondary | ICD-10-CM | POA: Diagnosis not present

## 2018-08-01 DIAGNOSIS — R51 Headache: Secondary | ICD-10-CM | POA: Diagnosis not present

## 2018-08-01 DIAGNOSIS — S134XXD Sprain of ligaments of cervical spine, subsequent encounter: Secondary | ICD-10-CM | POA: Diagnosis not present

## 2018-08-05 DIAGNOSIS — S134XXD Sprain of ligaments of cervical spine, subsequent encounter: Secondary | ICD-10-CM | POA: Diagnosis not present

## 2018-08-05 DIAGNOSIS — S8010XD Contusion of unspecified lower leg, subsequent encounter: Secondary | ICD-10-CM | POA: Diagnosis not present

## 2018-08-05 DIAGNOSIS — S060X9S Concussion with loss of consciousness of unspecified duration, sequela: Secondary | ICD-10-CM | POA: Diagnosis not present

## 2018-08-07 DIAGNOSIS — S8010XD Contusion of unspecified lower leg, subsequent encounter: Secondary | ICD-10-CM | POA: Diagnosis not present

## 2018-08-07 DIAGNOSIS — Z6826 Body mass index (BMI) 26.0-26.9, adult: Secondary | ICD-10-CM | POA: Diagnosis not present

## 2018-08-12 DIAGNOSIS — Z6825 Body mass index (BMI) 25.0-25.9, adult: Secondary | ICD-10-CM | POA: Diagnosis not present

## 2018-08-12 DIAGNOSIS — R51 Headache: Secondary | ICD-10-CM | POA: Diagnosis not present

## 2018-08-12 DIAGNOSIS — S060X9S Concussion with loss of consciousness of unspecified duration, sequela: Secondary | ICD-10-CM | POA: Diagnosis not present

## 2018-08-16 DIAGNOSIS — S8010XD Contusion of unspecified lower leg, subsequent encounter: Secondary | ICD-10-CM | POA: Diagnosis not present

## 2018-08-16 DIAGNOSIS — S134XXD Sprain of ligaments of cervical spine, subsequent encounter: Secondary | ICD-10-CM | POA: Diagnosis not present

## 2018-08-16 DIAGNOSIS — G43909 Migraine, unspecified, not intractable, without status migrainosus: Secondary | ICD-10-CM | POA: Diagnosis not present

## 2018-09-06 DIAGNOSIS — S060X9S Concussion with loss of consciousness of unspecified duration, sequela: Secondary | ICD-10-CM | POA: Diagnosis not present

## 2018-09-06 DIAGNOSIS — G43909 Migraine, unspecified, not intractable, without status migrainosus: Secondary | ICD-10-CM | POA: Diagnosis not present

## 2018-09-11 DIAGNOSIS — Z0000 Encounter for general adult medical examination without abnormal findings: Secondary | ICD-10-CM | POA: Diagnosis not present

## 2018-09-25 DIAGNOSIS — N390 Urinary tract infection, site not specified: Secondary | ICD-10-CM | POA: Diagnosis not present

## 2018-09-25 DIAGNOSIS — Z0000 Encounter for general adult medical examination without abnormal findings: Secondary | ICD-10-CM | POA: Diagnosis not present

## 2018-09-25 DIAGNOSIS — Z6826 Body mass index (BMI) 26.0-26.9, adult: Secondary | ICD-10-CM | POA: Diagnosis not present

## 2018-10-08 DIAGNOSIS — Z6826 Body mass index (BMI) 26.0-26.9, adult: Secondary | ICD-10-CM | POA: Diagnosis not present

## 2018-10-08 DIAGNOSIS — N390 Urinary tract infection, site not specified: Secondary | ICD-10-CM | POA: Diagnosis not present

## 2018-10-10 DIAGNOSIS — D225 Melanocytic nevi of trunk: Secondary | ICD-10-CM | POA: Diagnosis not present

## 2018-10-10 DIAGNOSIS — L821 Other seborrheic keratosis: Secondary | ICD-10-CM | POA: Diagnosis not present

## 2018-10-16 ENCOUNTER — Encounter (HOSPITAL_COMMUNITY): Payer: Self-pay

## 2018-10-16 ENCOUNTER — Ambulatory Visit (HOSPITAL_COMMUNITY)
Admission: EM | Admit: 2018-10-16 | Discharge: 2018-10-16 | Disposition: A | Discharge status: Home / Self Care | Payer: Commercial Managed Care - PPO | Attending: Family Medicine | Admitting: Family Medicine

## 2018-10-16 DIAGNOSIS — Z79899 Other long term (current) drug therapy: Secondary | ICD-10-CM | POA: Diagnosis not present

## 2018-10-16 DIAGNOSIS — J45909 Unspecified asthma, uncomplicated: Secondary | ICD-10-CM | POA: Diagnosis not present

## 2018-10-16 DIAGNOSIS — R350 Frequency of micturition: Secondary | ICD-10-CM | POA: Diagnosis not present

## 2018-10-16 DIAGNOSIS — R3915 Urgency of urination: Secondary | ICD-10-CM | POA: Diagnosis not present

## 2018-10-16 DIAGNOSIS — R319 Hematuria, unspecified: Secondary | ICD-10-CM

## 2018-10-16 DIAGNOSIS — K219 Gastro-esophageal reflux disease without esophagitis: Secondary | ICD-10-CM | POA: Insufficient documentation

## 2018-10-16 DIAGNOSIS — Z87442 Personal history of urinary calculi: Secondary | ICD-10-CM | POA: Insufficient documentation

## 2018-10-16 LAB — POCT URINALYSIS DIP (DEVICE)
Bilirubin Urine: NEGATIVE
Glucose, UA: NEGATIVE mg/dL
KETONES UR: NEGATIVE mg/dL
Leukocytes, UA: NEGATIVE
Nitrite: NEGATIVE
PH: 7 (ref 5.0–8.0)
PROTEIN: NEGATIVE mg/dL
SPECIFIC GRAVITY, URINE: 1.015 (ref 1.005–1.030)
Urobilinogen, UA: 0.2 mg/dL (ref 0.0–1.0)

## 2018-10-16 NOTE — ED Triage Notes (Signed)
Pt presents with recurring urinary tract infection, pt has had 2 rounds of antibiotics with no relief. Pt states she is having a lot of urine frequency and it worse when she is walking around.

## 2018-10-16 NOTE — ED Provider Notes (Signed)
[facility]   CC: UTI symptoms  SUBJECTIVE:  [name] [name] is a 52 y.o. female who complains of urinary frequency, urgency and strong urine odor for approximately 1 month.  Was seen by PCP twice for similar symptoms, and treated with 2 rounds of antibiotics for UTI. Does admit to excessive caffeine intake. Denies abdominal or flank pain.  Tried OTC medication with temporary relief.  Symptoms are made worse with moving around.  Denies fever, chills, nausea, vomiting, abnormal vaginal discharge or bleeding, hematuria.    LMP: Patient's last menstrual period was 10/04/2015.  ROS: As in HPI.  Past Medical History:  Diagnosis Date  . Anemia   . Asthma   . Blood transfusion without reported diagnosis   . GERD (gastroesophageal reflux disease)   . Kidney stones   . Migraine    Past Surgical History:  Procedure Laterality Date  . SPLENECTOMY    . TUBAL LIGATION    . UTERINE FIBROID SURGERY     Allergies  Allergen Reactions  . Grapefruit Extract     Results of an allergy test  . Vicodin [Hydrocodone-Acetaminophen] Swelling    rash  . Penicillins Swelling and Rash   No current facility-administered medications on file prior to encounter.    Current Outpatient Medications on File Prior to Encounter  Medication Sig Dispense Refill  . omeprazole (PRILOSEC) 40 MG capsule Take 40 mg by mouth daily.    . SUMAtriptan (IMITREX) 100 MG tablet Take 50 mg by mouth every 2 (two) hours as needed. For migraine     Social History   Socioeconomic History  . Marital status: Divorced    Spouse name: Not on file  . Number of children: Not on file  . Years of education: Not on file  . Highest education level: Not on file  Occupational History  . Occupation: Architectural technologist: [employer]  Social Needs  . Financial resource strain: Not on file  . Food insecurity:    Worry: Not on file    Inability: Not on file  . Transportation needs:    Medical: Not on file   Non-medical: Not on file  Tobacco Use  . Smoking status: Never Smoker  . Smokeless tobacco: Never Used  Substance and Sexual Activity  . Alcohol use: Yes    Alcohol/week: 0.0 standard drinks    Comment: OCC.  . Drug use: No  . Sexual activity: Not on file  Lifestyle  . Physical activity:    Days per week: Not on file    Minutes per session: Not on file  . Stress: Not on file  Relationships  . Social connections:    Talks on phone: Not on file    Gets together: Not on file    Attends religious service: Not on file    Active member of club or organization: Not on file    Attends meetings of clubs or organizations: Not on file    Relationship status: Not on file  . Intimate partner violence:    Fear of current or ex partner: Not on file    Emotionally abused: Not on file    Physically abused: Not on file    Forced sexual activity: Not on file  Other Topics Concern  . Not on file  Social History Narrative  . Not on file   Family History  Problem Relation Age of Onset  . Lung cancer Maternal Uncle   . Colon cancer Maternal Uncle 24  .  Lung cancer Maternal Grandmother     OBJECTIVE:  Vitals:   10/16/18 1026  BP: 111/71  Pulse: 72  Resp: 16  Temp: 98.1 F (36.7 C)  TempSrc: Oral  SpO2: 100%   General appearance: AOx3 in no acute distress HEENT: NCAT.  Oropharynx clear.  Lungs: clear to auscultation bilaterally without adventitious breath sounds Heart: regular rate and rhythm.  Radial pulses 2+ symmetrical bilaterally Abdomen: soft; non-distended; no tenderness; bowel sounds present; no guarding Back: no CVA tenderness Extremities: no edema; symmetrical with no gross deformities Skin: warm and dry Neurologic: Ambulates from chair to exam table without difficulty Psychological: alert and cooperative; normal mood and affect  Labs Reviewed  POCT URINALYSIS DIP (DEVICE) - Abnormal; Notable for the following components:      Result Value   Hgb urine dipstick TRACE  (*)    All other components within normal limits    ASSESSMENT & PLAN:  1. Urinary frequency   2. Urinary urgency   3. Hematuria, unspecified type    Urine did not show signs of infection, but did show trace blood.   Urine culture sent.  We will call you with abnormal results.   Push fluids and get plenty of rest.  Limit caffeine intake Follow up with PCP or with urology for further evaluation and management of symptoms Return here or go to ER if you have any new or worsening symptoms such as fever, worsening abdominal pain, nausea/vomiting, flank pain, etc...  Outlined signs and symptoms indicating need for more acute intervention. Patient verbalized understanding. After Visit Summary given.     Lestine Box, PA-C 10/16/18 1117
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
## 2018-10-16 NOTE — Discharge Instructions (Signed)
Urine did not show signs of infection, but did show trace blood.   Urine culture sent.  We will call you with abnormal results.   Push fluids and get plenty of rest.  Limit caffeine intake Follow up with PCP or with urology for further evaluation and management of symptoms Return here or go to ER if you have any new or worsening symptoms such as fever, worsening abdominal pain, nausea/vomiting, flank pain, etc..Marland Kitchen

## 2018-10-17 LAB — URINE CULTURE: CULTURE: NO GROWTH

## 2018-10-21 DIAGNOSIS — R3915 Urgency of urination: Secondary | ICD-10-CM | POA: Diagnosis not present

## 2018-10-21 DIAGNOSIS — N760 Acute vaginitis: Secondary | ICD-10-CM | POA: Diagnosis not present

## 2018-10-21 DIAGNOSIS — K219 Gastro-esophageal reflux disease without esophagitis: Secondary | ICD-10-CM | POA: Diagnosis not present

## 2018-10-21 DIAGNOSIS — Z6826 Body mass index (BMI) 26.0-26.9, adult: Secondary | ICD-10-CM | POA: Diagnosis not present

## 2018-10-21 DIAGNOSIS — N390 Urinary tract infection, site not specified: Secondary | ICD-10-CM | POA: Diagnosis not present

## 2018-10-21 DIAGNOSIS — Z118 Encounter for screening for other infectious and parasitic diseases: Secondary | ICD-10-CM | POA: Diagnosis not present

## 2018-10-21 DIAGNOSIS — G43909 Migraine, unspecified, not intractable, without status migrainosus: Secondary | ICD-10-CM | POA: Diagnosis not present

## 2018-11-04 DIAGNOSIS — N390 Urinary tract infection, site not specified: Secondary | ICD-10-CM | POA: Diagnosis not present

## 2018-11-04 DIAGNOSIS — R350 Frequency of micturition: Secondary | ICD-10-CM | POA: Diagnosis not present

## 2018-11-05 DIAGNOSIS — N952 Postmenopausal atrophic vaginitis: Secondary | ICD-10-CM | POA: Diagnosis not present

## 2018-11-05 DIAGNOSIS — N3020 Other chronic cystitis without hematuria: Secondary | ICD-10-CM | POA: Diagnosis not present

## 2018-11-05 DIAGNOSIS — N3281 Overactive bladder: Secondary | ICD-10-CM | POA: Diagnosis not present

## 2018-11-11 DIAGNOSIS — R311 Benign essential microscopic hematuria: Secondary | ICD-10-CM | POA: Diagnosis not present

## 2018-11-11 DIAGNOSIS — R3915 Urgency of urination: Secondary | ICD-10-CM | POA: Diagnosis not present

## 2018-11-15 DIAGNOSIS — R3129 Other microscopic hematuria: Secondary | ICD-10-CM | POA: Diagnosis not present

## 2018-11-15 DIAGNOSIS — R311 Benign essential microscopic hematuria: Secondary | ICD-10-CM | POA: Diagnosis not present

## 2018-11-28 DIAGNOSIS — R311 Benign essential microscopic hematuria: Secondary | ICD-10-CM | POA: Diagnosis not present

## 2018-11-28 DIAGNOSIS — R3915 Urgency of urination: Secondary | ICD-10-CM | POA: Diagnosis not present

## 2018-12-02 DIAGNOSIS — Z1231 Encounter for screening mammogram for malignant neoplasm of breast: Secondary | ICD-10-CM | POA: Diagnosis not present

## 2018-12-02 DIAGNOSIS — Z803 Family history of malignant neoplasm of breast: Secondary | ICD-10-CM | POA: Diagnosis not present

## 2018-12-11 DIAGNOSIS — N949 Unspecified condition associated with female genital organs and menstrual cycle: Secondary | ICD-10-CM | POA: Diagnosis not present

## 2018-12-23 DIAGNOSIS — N949 Unspecified condition associated with female genital organs and menstrual cycle: Secondary | ICD-10-CM | POA: Diagnosis not present

## 2019-01-02 ENCOUNTER — Other Ambulatory Visit: Payer: Self-pay | Admitting: Obstetrics & Gynecology

## 2019-01-02 ENCOUNTER — Other Ambulatory Visit: Payer: Self-pay | Admitting: Obstetrics and Gynecology

## 2019-01-09 ENCOUNTER — Encounter (HOSPITAL_BASED_OUTPATIENT_CLINIC_OR_DEPARTMENT_OTHER): Payer: Self-pay | Admitting: *Deleted

## 2019-01-09 ENCOUNTER — Other Ambulatory Visit: Payer: Self-pay

## 2019-01-09 NOTE — Progress Notes (Signed)
Spoke with patient via telephone for pre op interview. NPO after MN. Patient to take Prilosec and Imitrex PRN AM of surgery with a sip of water. Will need CBC AM of surgery. Arrival time 0530.

## 2019-01-14 ENCOUNTER — Ambulatory Visit (HOSPITAL_BASED_OUTPATIENT_CLINIC_OR_DEPARTMENT_OTHER): Payer: Commercial Managed Care - PPO | Admitting: Anesthesiology

## 2019-01-14 ENCOUNTER — Encounter (HOSPITAL_BASED_OUTPATIENT_CLINIC_OR_DEPARTMENT_OTHER): Payer: Self-pay | Admitting: Emergency Medicine

## 2019-01-14 ENCOUNTER — Encounter (HOSPITAL_BASED_OUTPATIENT_CLINIC_OR_DEPARTMENT_OTHER)
Admission: RE | Disposition: A | Discharge status: Home Health | Payer: Self-pay | Source: Home / Self Care | Attending: Obstetrics and Gynecology

## 2019-01-14 ENCOUNTER — Observation Stay (HOSPITAL_BASED_OUTPATIENT_CLINIC_OR_DEPARTMENT_OTHER)
Admission: RE | Admit: 2019-01-14 | Discharge: 2019-01-14 | Disposition: A | Discharge status: Home Health | Payer: Commercial Managed Care - PPO | Attending: Obstetrics and Gynecology | Admitting: Obstetrics and Gynecology

## 2019-01-14 ENCOUNTER — Other Ambulatory Visit: Payer: Self-pay

## 2019-01-14 DIAGNOSIS — N8301 Follicular cyst of right ovary: Secondary | ICD-10-CM | POA: Diagnosis not present

## 2019-01-14 DIAGNOSIS — Z79899 Other long term (current) drug therapy: Secondary | ICD-10-CM | POA: Insufficient documentation

## 2019-01-14 DIAGNOSIS — N736 Female pelvic peritoneal adhesions (postinfective): Secondary | ICD-10-CM | POA: Insufficient documentation

## 2019-01-14 DIAGNOSIS — K219 Gastro-esophageal reflux disease without esophagitis: Secondary | ICD-10-CM | POA: Insufficient documentation

## 2019-01-14 DIAGNOSIS — N83201 Unspecified ovarian cyst, right side: Secondary | ICD-10-CM | POA: Diagnosis not present

## 2019-01-14 DIAGNOSIS — D270 Benign neoplasm of right ovary: Secondary | ICD-10-CM | POA: Diagnosis not present

## 2019-01-14 DIAGNOSIS — N83291 Other ovarian cyst, right side: Secondary | ICD-10-CM | POA: Diagnosis not present

## 2019-01-14 DIAGNOSIS — D271 Benign neoplasm of left ovary: Secondary | ICD-10-CM | POA: Diagnosis not present

## 2019-01-14 DIAGNOSIS — N838 Other noninflammatory disorders of ovary, fallopian tube and broad ligament: Secondary | ICD-10-CM | POA: Diagnosis present

## 2019-01-14 DIAGNOSIS — Z5331 Laparoscopic surgical procedure converted to open procedure: Secondary | ICD-10-CM | POA: Diagnosis not present

## 2019-01-14 DIAGNOSIS — G43909 Migraine, unspecified, not intractable, without status migrainosus: Secondary | ICD-10-CM | POA: Diagnosis not present

## 2019-01-14 DIAGNOSIS — Z780 Asymptomatic menopausal state: Secondary | ICD-10-CM | POA: Insufficient documentation

## 2019-01-14 DIAGNOSIS — N83292 Other ovarian cyst, left side: Secondary | ICD-10-CM | POA: Diagnosis not present

## 2019-01-14 DIAGNOSIS — N83202 Unspecified ovarian cyst, left side: Secondary | ICD-10-CM | POA: Diagnosis present

## 2019-01-14 HISTORY — PX: LAPAROTOMY: SHX154

## 2019-01-14 HISTORY — PX: LAPAROSCOPIC BILATERAL SALPINGO OOPHERECTOMY: SHX5890

## 2019-01-14 LAB — CBC
HEMATOCRIT: 42.6 % (ref 36.0–46.0)
Hemoglobin: 14 g/dL (ref 12.0–15.0)
MCH: 31.2 pg (ref 26.0–34.0)
MCHC: 32.9 g/dL (ref 30.0–36.0)
MCV: 94.9 fL (ref 80.0–100.0)
Platelets: 400 10*3/uL (ref 150–400)
RBC: 4.49 MIL/uL (ref 3.87–5.11)
RDW: 13.1 % (ref 11.5–15.5)
WBC: 7.9 10*3/uL (ref 4.0–10.5)
nRBC: 0 % (ref 0.0–0.2)

## 2019-01-14 LAB — POCT PREGNANCY, URINE: Preg Test, Ur: NEGATIVE

## 2019-01-14 SURGERY — SALPINGO-OOPHORECTOMY, BILATERAL, LAPAROSCOPIC
Anesthesia: General

## 2019-01-14 MED ORDER — PHENYLEPHRINE 40 MCG/ML (10ML) SYRINGE FOR IV PUSH (FOR BLOOD PRESSURE SUPPORT)
PREFILLED_SYRINGE | INTRAVENOUS | Status: AC
  Filled 2019-01-14: qty 10

## 2019-01-14 MED ORDER — OXYCODONE-ACETAMINOPHEN 5-325 MG PO TABS
ORAL_TABLET | ORAL | Status: AC
  Filled 2019-01-14: qty 1

## 2019-01-14 MED ORDER — ONDANSETRON HCL 4 MG/2ML IJ SOLN
4.0000 mg | Freq: Four times a day (QID) | INTRAMUSCULAR | Status: DC | PRN
  Filled 2019-01-14: qty 2

## 2019-01-14 MED ORDER — KETOROLAC TROMETHAMINE 30 MG/ML IJ SOLN
INTRAMUSCULAR | Status: DC | PRN
  Administered 2019-01-14: 30 mg via INTRAVENOUS

## 2019-01-14 MED ORDER — LIDOCAINE 2% (20 MG/ML) 5 ML SYRINGE
INTRAMUSCULAR | Status: DC | PRN
  Administered 2019-01-14: 60 mg via INTRAVENOUS

## 2019-01-14 MED ORDER — CLINDAMYCIN PHOSPHATE 900 MG/50ML IV SOLN
INTRAVENOUS | Status: AC
  Filled 2019-01-14: qty 50

## 2019-01-14 MED ORDER — ONDANSETRON HCL 4 MG/2ML IJ SOLN
INTRAMUSCULAR | Status: DC | PRN
  Administered 2019-01-14: 4 mg via INTRAVENOUS

## 2019-01-14 MED ORDER — IBUPROFEN 600 MG PO TABS
600.0000 mg | ORAL_TABLET | Freq: Four times a day (QID) | ORAL | Status: DC | PRN
  Administered 2019-01-14: 600 mg via ORAL
  Filled 2019-01-14: qty 1

## 2019-01-14 MED ORDER — ROCURONIUM BROMIDE 100 MG/10ML IV SOLN
INTRAVENOUS | Status: AC
  Filled 2019-01-14: qty 1

## 2019-01-14 MED ORDER — LISDEXAMFETAMINE DIMESYLATE 30 MG PO CAPS
30.0000 mg | ORAL_CAPSULE | Freq: Every day | ORAL | Status: DC
  Filled 2019-01-14: qty 1

## 2019-01-14 MED ORDER — MIDAZOLAM HCL 2 MG/2ML IJ SOLN
INTRAMUSCULAR | Status: DC | PRN
  Administered 2019-01-14: 2 mg via INTRAVENOUS

## 2019-01-14 MED ORDER — PROPOFOL 10 MG/ML IV BOLUS
INTRAVENOUS | Status: AC
  Filled 2019-01-14: qty 20

## 2019-01-14 MED ORDER — SUGAMMADEX SODIUM 200 MG/2ML IV SOLN
INTRAVENOUS | Status: DC | PRN
  Administered 2019-01-14: 150 mg via INTRAVENOUS

## 2019-01-14 MED ORDER — KETOROLAC TROMETHAMINE 30 MG/ML IJ SOLN
INTRAMUSCULAR | Status: AC
  Filled 2019-01-14: qty 1

## 2019-01-14 MED ORDER — LACTATED RINGERS IV SOLN
INTRAVENOUS | Status: DC
  Administered 2019-01-14 (×3): via INTRAVENOUS
  Filled 2019-01-14: qty 1000

## 2019-01-14 MED ORDER — FENTANYL CITRATE (PF) 100 MCG/2ML IJ SOLN
25.0000 ug | INTRAMUSCULAR | Status: DC | PRN
  Administered 2019-01-14 (×2): 50 ug via INTRAVENOUS
  Filled 2019-01-14: qty 1

## 2019-01-14 MED ORDER — PANTOPRAZOLE SODIUM 40 MG PO TBEC
40.0000 mg | DELAYED_RELEASE_TABLET | Freq: Every day | ORAL | Status: DC
  Filled 2019-01-14: qty 1

## 2019-01-14 MED ORDER — SUGAMMADEX SODIUM 200 MG/2ML IV SOLN
INTRAVENOUS | Status: AC
  Filled 2019-01-14: qty 2

## 2019-01-14 MED ORDER — FENTANYL CITRATE (PF) 100 MCG/2ML IJ SOLN
INTRAMUSCULAR | Status: AC
  Filled 2019-01-14: qty 2

## 2019-01-14 MED ORDER — ONDANSETRON HCL 4 MG/2ML IJ SOLN
INTRAMUSCULAR | Status: AC
  Filled 2019-01-14: qty 2

## 2019-01-14 MED ORDER — GLYCOPYRROLATE PF 0.2 MG/ML IJ SOSY
PREFILLED_SYRINGE | INTRAMUSCULAR | Status: AC
  Filled 2019-01-14: qty 1

## 2019-01-14 MED ORDER — OXYCODONE HCL 5 MG/5ML PO SOLN
5.0000 mg | Freq: Once | ORAL | Status: DC | PRN
  Filled 2019-01-14: qty 5

## 2019-01-14 MED ORDER — DEXAMETHASONE SODIUM PHOSPHATE 10 MG/ML IJ SOLN
INTRAMUSCULAR | Status: AC
  Filled 2019-01-14: qty 1

## 2019-01-14 MED ORDER — SCOPOLAMINE 1 MG/3DAYS TD PT72
MEDICATED_PATCH | TRANSDERMAL | Status: AC
  Filled 2019-01-14: qty 1

## 2019-01-14 MED ORDER — PROPOFOL 10 MG/ML IV BOLUS
INTRAVENOUS | Status: DC | PRN
  Administered 2019-01-14: 150 mg via INTRAVENOUS

## 2019-01-14 MED ORDER — MIDAZOLAM HCL 2 MG/2ML IJ SOLN
INTRAMUSCULAR | Status: AC
  Filled 2019-01-14: qty 2

## 2019-01-14 MED ORDER — OXYCODONE-ACETAMINOPHEN 5-325 MG PO TABS: 1.0000 | ORAL_TABLET | ORAL | 0 refills | Status: DC | PRN

## 2019-01-14 MED ORDER — SODIUM CHLORIDE 0.9 % IV SOLN
INTRAVENOUS | Status: DC
  Filled 2019-01-14: qty 1000

## 2019-01-14 MED ORDER — KETOROLAC TROMETHAMINE 30 MG/ML IJ SOLN
30.0000 mg | Freq: Once | INTRAMUSCULAR | Status: AC
  Administered 2019-01-14: 30 mg via INTRAVENOUS
  Filled 2019-01-14: qty 1

## 2019-01-14 MED ORDER — LIDOCAINE 2% (20 MG/ML) 5 ML SYRINGE
INTRAMUSCULAR | Status: AC
  Filled 2019-01-14: qty 5

## 2019-01-14 MED ORDER — FENTANYL CITRATE (PF) 250 MCG/5ML IJ SOLN
INTRAMUSCULAR | Status: AC
  Filled 2019-01-14: qty 5

## 2019-01-14 MED ORDER — IBUPROFEN 200 MG PO TABS
ORAL_TABLET | ORAL | Status: AC
  Filled 2019-01-14: qty 3

## 2019-01-14 MED ORDER — OXYCODONE HCL 5 MG PO TABS
5.0000 mg | ORAL_TABLET | Freq: Once | ORAL | Status: DC | PRN
  Filled 2019-01-14: qty 1

## 2019-01-14 MED ORDER — BUPIVACAINE HCL (PF) 0.25 % IJ SOLN
INTRAMUSCULAR | Status: DC | PRN
  Administered 2019-01-14: 6 mL

## 2019-01-14 MED ORDER — CLINDAMYCIN PHOSPHATE 900 MG/50ML IV SOLN
900.0000 mg | INTRAVENOUS | Status: AC
  Administered 2019-01-14: 900 mg via INTRAVENOUS
  Filled 2019-01-14: qty 50

## 2019-01-14 MED ORDER — OXYCODONE-ACETAMINOPHEN 5-325 MG PO TABS
1.0000 | ORAL_TABLET | ORAL | Status: DC | PRN
  Administered 2019-01-14 (×2): 1 via ORAL
  Filled 2019-01-14: qty 2

## 2019-01-14 MED ORDER — PHENYLEPHRINE 40 MCG/ML (10ML) SYRINGE FOR IV PUSH (FOR BLOOD PRESSURE SUPPORT)
PREFILLED_SYRINGE | INTRAVENOUS | Status: DC | PRN
  Administered 2019-01-14 (×5): 80 ug via INTRAVENOUS
  Administered 2019-01-14: 40 ug via INTRAVENOUS
  Administered 2019-01-14: 80 ug via INTRAVENOUS

## 2019-01-14 MED ORDER — DEXAMETHASONE SODIUM PHOSPHATE 10 MG/ML IJ SOLN
INTRAMUSCULAR | Status: DC | PRN
  Administered 2019-01-14: 10 mg via INTRAVENOUS

## 2019-01-14 MED ORDER — GENTAMICIN SULFATE 40 MG/ML IJ SOLN
5.0000 mg/kg | INTRAVENOUS | Status: AC
  Administered 2019-01-14: 323.6 mg via INTRAVENOUS
  Filled 2019-01-14 (×3): qty 8

## 2019-01-14 MED ORDER — ROCURONIUM BROMIDE 10 MG/ML (PF) SYRINGE
PREFILLED_SYRINGE | INTRAVENOUS | Status: DC | PRN
  Administered 2019-01-14: 20 mg via INTRAVENOUS
  Administered 2019-01-14: 40 mg via INTRAVENOUS

## 2019-01-14 MED ORDER — GLYCOPYRROLATE PF 0.2 MG/ML IJ SOSY
PREFILLED_SYRINGE | INTRAMUSCULAR | Status: DC | PRN
  Administered 2019-01-14: .2 mg via INTRAVENOUS

## 2019-01-14 MED ORDER — FENTANYL CITRATE (PF) 100 MCG/2ML IJ SOLN
INTRAMUSCULAR | Status: DC | PRN
  Administered 2019-01-14 (×3): 25 ug via INTRAVENOUS
  Administered 2019-01-14: 50 ug via INTRAVENOUS
  Administered 2019-01-14: 25 ug via INTRAVENOUS
  Administered 2019-01-14: 50 ug via INTRAVENOUS

## 2019-01-14 MED ORDER — SCOPOLAMINE 1 MG/3DAYS TD PT72
1.0000 | MEDICATED_PATCH | Freq: Once | TRANSDERMAL | Status: DC
  Administered 2019-01-14: 1.5 mg via TRANSDERMAL
  Filled 2019-01-14: qty 1

## 2019-01-14 SURGICAL SUPPLY — 62 items
BENZOIN TINCTURE PRP APPL 2/3 (GAUZE/BANDAGES/DRESSINGS) ×3 IMPLANT
BLADE SURG 10 STRL SS (BLADE) ×6 IMPLANT
CABLE HIGH FREQUENCY MONO STRZ (ELECTRODE) IMPLANT
CANISTER SUCT 3000ML PPV (MISCELLANEOUS) ×3 IMPLANT
CATH ROBINSON RED A/P 16FR (CATHETERS) IMPLANT
CELLS DAT CNTRL 66122 CELL SVR (MISCELLANEOUS) ×2 IMPLANT
CONT PATH 16OZ SNAP LID 3702 (MISCELLANEOUS) IMPLANT
COVER WAND RF STERILE (DRAPES) ×3 IMPLANT
DERMABOND ADVANCED (GAUZE/BANDAGES/DRESSINGS) ×1
DERMABOND ADVANCED .7 DNX12 (GAUZE/BANDAGES/DRESSINGS) ×2 IMPLANT
DRAPE WARM FLUID 44X44 (DRAPE) ×3 IMPLANT
DRSG COVADERM PLUS 2X2 (GAUZE/BANDAGES/DRESSINGS) ×3 IMPLANT
DRSG OPSITE POSTOP 3X4 (GAUZE/BANDAGES/DRESSINGS) ×3 IMPLANT
DRSG OPSITE POSTOP 4X10 (GAUZE/BANDAGES/DRESSINGS) ×3 IMPLANT
DURAPREP 26ML APPLICATOR (WOUND CARE) ×3 IMPLANT
GAUZE 4X4 16PLY RFD (DISPOSABLE) ×3 IMPLANT
GLOVE BIOGEL PI IND STRL 6.5 (GLOVE) ×4 IMPLANT
GLOVE BIOGEL PI IND STRL 7.0 (GLOVE) ×6 IMPLANT
GLOVE BIOGEL PI INDICATOR 6.5 (GLOVE) ×2
GLOVE BIOGEL PI INDICATOR 7.0 (GLOVE) ×3
GLOVE ECLIPSE 6.5 STRL STRAW (GLOVE) ×3 IMPLANT
GOWN STRL REUS W/TWL LRG LVL3 (GOWN DISPOSABLE) ×12 IMPLANT
HIBICLENS CHG 4% 4OZ BTL (MISCELLANEOUS) IMPLANT
LIGASURE IMPACT 36 18CM CVD LR (INSTRUMENTS) ×3 IMPLANT
MANIPULATOR UTERINE 4.5 ZUMI (MISCELLANEOUS) ×3 IMPLANT
NS IRRIG 1000ML POUR BTL (IV SOLUTION) ×6 IMPLANT
PACK ABDOMINAL GYN (CUSTOM PROCEDURE TRAY) ×3 IMPLANT
PACK LAPAROSCOPY BASIN (CUSTOM PROCEDURE TRAY) ×3 IMPLANT
PACK TRENDGUARD 450 HYBRID PRO (MISCELLANEOUS) ×2 IMPLANT
PAD OB MATERNITY 4.3X12.25 (PERSONAL CARE ITEMS) ×3 IMPLANT
POUCH SPECIMEN RETRIEVAL 10MM (ENDOMECHANICALS) IMPLANT
PROTECTOR NERVE ULNAR (MISCELLANEOUS) IMPLANT
RTRCTR C-SECT PINK 25CM LRG (MISCELLANEOUS) IMPLANT
RTRCTR WOUND ALEXIS 18CM MED (MISCELLANEOUS) ×3
SCISSORS LAP 5X35 DISP (ENDOMECHANICALS) IMPLANT
SET IRRIG TUBING LAPAROSCOPIC (IRRIGATION / IRRIGATOR) IMPLANT
SHEARS HARMONIC ACE PLUS 36CM (ENDOMECHANICALS) IMPLANT
SLEEVE XCEL OPT CAN 5 100 (ENDOMECHANICALS) IMPLANT
SPONGE LAP 18X18 RF (DISPOSABLE) ×6 IMPLANT
STRIP CLOSURE SKIN 1/2X4 (GAUZE/BANDAGES/DRESSINGS) ×3 IMPLANT
SUT MON AB 2-0 CT1 27 (SUTURE) IMPLANT
SUT MON AB-0 CT1 36 (SUTURE) IMPLANT
SUT PLAIN 2 0 XLH (SUTURE) ×3 IMPLANT
SUT PROLENE 0 CT 1 30 (SUTURE) IMPLANT
SUT VIC AB 0 CT1 18XCR BRD8 (SUTURE) IMPLANT
SUT VIC AB 0 CT1 27 (SUTURE) ×1
SUT VIC AB 0 CT1 27XBRD ANBCTR (SUTURE) ×2 IMPLANT
SUT VIC AB 0 CT1 8-18 (SUTURE)
SUT VIC AB 4-0 PS2 27 (SUTURE) ×3 IMPLANT
SUT VICRYL 0 TIES 12 18 (SUTURE) ×3 IMPLANT
SUT VICRYL 0 UR6 27IN ABS (SUTURE) ×3 IMPLANT
SUT VICRYL 4-0 PS2 18IN ABS (SUTURE) ×3 IMPLANT
SYR 5ML LL (SYRINGE) ×3 IMPLANT
TOWEL OR 17X26 10 PK STRL BLUE (TOWEL DISPOSABLE) ×6 IMPLANT
TRAY FOLEY W/BAG SLVR 14FR (SET/KITS/TRAYS/PACK) ×3 IMPLANT
TRENDGUARD 450 HYBRID PRO PACK (MISCELLANEOUS) ×3
TROCAR XCEL NON-BLD 11X100MML (ENDOMECHANICALS) IMPLANT
TROCAR XCEL NON-BLD 5MMX100MML (ENDOMECHANICALS) ×6 IMPLANT
TUBE CONNECTING 12X1/4 (SUCTIONS) ×3 IMPLANT
TUBING EVAC SMOKE HEATED PNEUM (TUBING) ×3 IMPLANT
WARMER LAPAROSCOPE (MISCELLANEOUS) ×3 IMPLANT
YANKAUER SUCT BULB TIP NO VENT (SUCTIONS) ×3 IMPLANT

## 2019-01-14 NOTE — Op Note (Signed)
Preoperative diagnosis: bilateral ovarian cysts, postmenopausal Postoperative diagnosis: Same Procedure: attempted laparoscoDr Azucena Fallen, MDpic bso, converted to mini-laparotomy, BSO Surgeon: Tiana Loft, MD Assistants: Azucena Fallen, MD Anesthesia Gen. Endotracheal IV fluids : 1759mlLR EBL : 20ml Urine : 189ml clear urine (foley) Complications : poor visualization through umbilical incision and conversion to mini-laparotomy, otherwise uncomplicated Disposition : observation  Specimens : bilateral tubes and ovaries  Procedure Patient seen in holding.  Reviewed plan for laparoscopic bso and informed written consent was obtained.  Risks reviewed including risk of bleeding, infection, injury to bowel, bladder, nerves, blood vessels, risk of transfution, risk of blood clot to lungs/legs, risk of anesthesia, risk of laparotomy, risk of further surgery.  Patient was brought to the operating room with IV running. Timeout was carried out. She underwent general anesthesia without difficulty and was given dorsal lithotomy position with arms tucked. Examination under anesthesia revealed small AV uterus. Patient was then prepped and draped in standard fashion. Foley catheter was placed and clear urine noted. Speculum was placed anterior lip of cervix was grasped with tenaculum  Unable to advance sound and small dilators were used to allow placement of hulka manipulator.   Tenaculum was removed from anterior lip and site hemostatic.  Gloves were changed attention was focused on the abdomen. A 10 mm vertical incision was made at the umbilicus after injecting 10 cc Marcaine. Incision was carried down to the fascia but then unable to confirm peritoneal entry.  Appeared that there was adhesions around this area.  With digital palpation unable to find an opening into the peritoneal cavity.  Uncertain if peritoneum had been entered  now in adhesions.  D/t the patient's high risk for abdominal adhesion decision  made to close umbilical incision and then enter pelvis through mini-laparotomy.   0-vicryl was used to close fascia and then 4-0 vicryl to close skin. A small transverse skin incision was then made and carried through down to the fascia.  The fascia was then entered sharply and extended laterally.  Rectus muscles were then easily divided at the midline and the peritoneum then entered sharply, tenting upward with hemostats.  The peritoneal incision was then stretched manually.  Two laps were then placed to keep bowel out of fied then medium alexis retractor then placed. The tubes/ovaries bilat and uterus were then examined.  Uterus appeared wnl.  Both tubes s/p btl with small knuckle at uterus and fibria bilaterally.  Left ovary with large simple cyst, about 5cm, rt ovary appeared wnl.  Attention then drawn to left ovary where filmy adhesions were lysed with the ligasure and the uterovarian ligament transected with the ligasure.  Good hemostasis noted.  The IP was then identified and transected with the ligasure.  Good hemostasis noted.  The fimbria was adhered to the ovary and removed also.  Attention was then drawn to the right sight and the right tubal remant and ovary were removed in a similar fashion with the ligasure. Pedicles hemostatic. All counts were correct x2 patient was reversed from general anesthesia extubated and brought out to the recovery room in stable condition.  Patient given gentamycin and clindamycin with laparotomy. Surgical findings were discussed with patient's family.

## 2019-01-14 NOTE — Discharge Summary (Signed)
Physician Discharge Summary  Patient ID: [name] [name] MRN: [id number] DOB/AGE: 05/29/1966 53 y.o.  Admit date: 01/14/2019 Discharge date: 01/14/2019  Admission Diagnoses: bilateral ovarian cysts  Discharge Diagnoses:  Active Problems:   Ovarian mass   Discharged Condition: good  Hospital Course: normal post op course, mini-laparotomy, bso  Consults: None  Significant Diagnostic Studies: n/a  Treatments: surgery: started l/s but unable to see past fascia d/t what appeared to be adhesions, incision closed and then uncomplicated mini-laparotomy with bso  Discharge Exam: Blood pressure 109/67, pulse 71, temperature 98 F (36.7 C), resp. rate 18, height 5\' 6"  (1.676 m), weight 72.9 kg, last menstrual period 10/04/2015, SpO2 97 %. normal post op exam, see note  Disposition: stable   Allergies as of 01/14/2019      Reactions   Grapefruit Extract    Results of an allergy test   Vicodin [hydrocodone-acetaminophen] Swelling   rash   Penicillins Swelling, Rash      Medication List    TAKE these medications   omeprazole 40 MG capsule Commonly known as:  PRILOSEC Take 40 mg by mouth daily.   oxyCODONE-acetaminophen 5-325 MG tablet Commonly known as:  PERCOCET/ROXICET Take 1-2 tablets by mouth every 4 (four) hours as needed for moderate pain or severe pain.   solifenacin 10 MG tablet Commonly known as:  VESICARE Take by mouth daily.   SUMAtriptan 100 MG tablet Commonly known as:  IMITREX Take 50 mg by mouth every 2 (two) hours as needed. For migraine   VYVANSE 30 MG capsule Generic drug:  lisdexamfetamine Take 30 mg by mouth daily.      Pt to take ibuprofen 600mg  po q 6 hrs prn mild/moderate pain    Signed: Charyl Bigger 01/14/2019, 6:05 PM

## 2019-01-14 NOTE — Transfer of Care (Signed)
Immediate Anesthesia Transfer of Care Note  Patient: [name] [name]  Procedure(s) Performed: ATTEMPTED LAPAROSCOPIC BILATERAL SALPINGO OOPHORECTOMY (Bilateral ) LAPAROTOMY BSO (N/A )  Patient Location: PACU  Anesthesia Type:General  Level of Consciousness: awake, alert  and oriented  Airway & Oxygen Therapy: Patient Spontanous Breathing and Patient connected to nasal cannula oxygen  Post-op Assessment: Report given to RN and Post -op Vital signs reviewed and stable  Post vital signs: Reviewed and stable  Last Vitals:  Vitals Value Taken Time  BP 108/50 01/14/2019 10:01 AM  Temp    Pulse 94 01/14/2019 10:04 AM  Resp 18 01/14/2019 10:04 AM  SpO2 96 % 01/14/2019 10:04 AM  Vitals shown include unvalidated device data.  Last Pain:  Vitals:   01/14/19 0539  TempSrc: Oral         Complications: No apparent anesthesia complications

## 2019-01-14 NOTE — Anesthesia Procedure Notes (Signed)
Procedure Name: Intubation Date/Time: 01/14/2019 7:48 AM Performed by: Suan Halter, CRNA Pre-anesthesia Checklist: Patient identified, Emergency Drugs available, Suction available and Patient being monitored Patient Re-evaluated:Patient Re-evaluated prior to induction Oxygen Delivery Method: Circle system utilized Preoxygenation: Pre-oxygenation with 100% oxygen Induction Type: IV induction Ventilation: Mask ventilation without difficulty Laryngoscope Size: Mac and 3 Grade View: Grade I Tube type: Oral Tube size: 7.0 mm Number of attempts: 1 Airway Equipment and Method: Stylet and Oral airway Placement Confirmation: ETT inserted through vocal cords under direct vision,  positive ETCO2 and breath sounds checked- equal and bilateral Secured at: 22 cm Tube secured with: Tape Dental Injury: Teeth and Oropharynx as per pre-operative assessment

## 2019-01-14 NOTE — Progress Notes (Signed)
Pt w/o c/o; would like to go home No n/v, tol po; ambulating some; no flatus but feels like ready to pass gas; voiding w/o difficulty  Temp:  [97 F (36.1 C)-98.8 F (37.1 C)] 98.8 F (37.1 C) (02/18 1549) Pulse Rate:  [73-99] 73 (02/18 1549) Resp:  [13-17] 16 (02/18 1549) BP: (96-112)/(50-75) 96/57 (02/18 1549) SpO2:  [95 %-98 %] 95 % (02/18 1549) Weight:  [72.9 kg] 72.9 kg (02/18 0539)  A&ox3 rrr ctab Abd: soft, nt, nd; +bs; dressing with small amout old blood at right side then otherwise dry/intact LE: no edema, nt bilat  A/P: pod 0 s/p l/s converted to mini-laparotomy 1. Doing well, meeting milestones; pain controlled; tol po; voiding; anticipate flatus soon; ok for d/c home; home with percocet/ibuprofen; f/u in 2 wks

## 2019-01-14 NOTE — H&P (Signed)
[name] [name] is an 53 y.o. female. Pt with h/o small complex right ovarian cyst, left ovarian vs paratubal cyst about 5cm, simple cyst.  Ca 125 normal.  Patient is asymptomatic but would like surgical management.    Pertinent Gynecological History: Menses: post-menopausal Bleeding: none Previous GYN Procedures: hysteroscopy x3, endometrial ablation    Menstrual History:  Patient's last menstrual period was 10/04/2015.    Past Medical History:  Diagnosis Date  . Anemia   . Asthma   . Blood transfusion without reported diagnosis   . GERD (gastroesophageal reflux disease)   . Kidney stones   . Migraine     Past Surgical History:  Procedure Laterality Date  . SPLENECTOMY    . TUBAL LIGATION    . UTERINE FIBROID SURGERY    exporatory laparotomy with bowel resection d/t bowel perforation/sepsis after trauma  Family History  Problem Relation Age of Onset  . Lung cancer Maternal Uncle   . Colon cancer Maternal Uncle 45  . Lung cancer Maternal Grandmother     Social History:  reports that she has never smoked. She has never used smokeless tobacco. She reports current alcohol use. She reports that she does not use drugs.  Allergies:  Allergies  Allergen Reactions  . Grapefruit Extract     Results of an allergy test  . Vicodin [Hydrocodone-Acetaminophen] Swelling    rash  . Penicillins Swelling and Rash    Medications Prior to Admission  Medication Sig Dispense Refill Last Dose  . lisdexamfetamine (VYVANSE) 30 MG capsule Take 30 mg by mouth daily.   01/13/2019 at Unknown time  . omeprazole (PRILOSEC) 40 MG capsule Take 40 mg by mouth daily.   Past Week at Unknown time  . solifenacin (VESICARE) 10 MG tablet Take by mouth daily.   01/13/2019 at Unknown time  . SUMAtriptan (IMITREX) 100 MG tablet Take 50 mg by mouth every 2 (two) hours as needed. For migraine   01/13/2019 at Unknown time    ROS SOB/chest pain/ HA/ vision changes/ LE pain or swelling/ etc.  Blood  pressure 112/75, pulse 88, temperature 98 F (36.7 C), temperature source Oral, resp. rate 16, height 5\' 6"  (1.676 m), weight 72.9 kg, last menstrual period 10/04/2015, SpO2 97 %. Physical Exam A&O x 3 HEENT : grossly wnl Lungs : ctab CV : rrr Abdo : soft, nt, nd Extr : no edema, nt bilat Pelvic : deferred   Results for orders placed or performed during the hospital encounter of 01/14/19 (from the past 24 hour(s))  CBC     Status: None   Collection Time: 01/14/19  6:07 AM  Result Value Ref Range   WBC 7.9 4.0 - 10.5 K/uL   RBC 4.49 3.87 - 5.11 MIL/uL   Hemoglobin 14.0 12.0 - 15.0 g/dL   HCT 42.6 36.0 - 46.0 %   MCV 94.9 80.0 - 100.0 fL   MCH 31.2 26.0 - 34.0 pg   MCHC 32.9 30.0 - 36.0 g/dL   RDW 13.1 11.5 - 15.5 %   Platelets 400 150 - 400 K/uL   nRBC 0.0 0.0 - 0.2 %  Pregnancy, urine POC     Status: None   Collection Time: 01/14/19  6:36 AM  Result Value Ref Range   Preg Test, Ur NEGATIVE NEGATIVE    No results found.  Assessment/Plan:  53 y/o with bilat ovarian cyst 1. Proceed to OR for l/s BSO.  Patient understands risks including bleeding, infection, possible need for transfusion, risk of injury  to bowel/bladder/nerves/blood vessels, risk of blood clot to legs, lungs, risk of anesthesia.  Consent signed.    Charyl Bigger 01/14/2019, 7:21 AM

## 2019-01-14 NOTE — Anesthesia Preprocedure Evaluation (Signed)
Anesthesia Evaluation  Patient identified by MRN, date of birth, ID band Patient awake    Reviewed: Allergy & Precautions, H&P , NPO status , Patient's Chart, lab work & pertinent test results  Airway Mallampati: II   Neck ROM: full    Dental   Pulmonary asthma ,    breath sounds clear to auscultation       Cardiovascular negative cardio ROS   Rhythm:regular Rate:Normal     Neuro/Psych  Headaches,    GI/Hepatic GERD  ,  Endo/Other    Renal/GU stones     Musculoskeletal   Abdominal   Peds  Hematology   Anesthesia Other Findings   Reproductive/Obstetrics                             Anesthesia Physical Anesthesia Plan  ASA: II  Anesthesia Plan: General   Post-op Pain Management:    Induction: Intravenous  PONV Risk Score and Plan: 3 and Ondansetron, Dexamethasone, Midazolam, Scopolamine patch - Pre-op and Treatment may vary due to age or medical condition  Airway Management Planned: Oral ETT  Additional Equipment:   Intra-op Plan:   Post-operative Plan: Extubation in OR  Informed Consent: I have reviewed the patients History and Physical, chart, labs and discussed the procedure including the risks, benefits and alternatives for the proposed anesthesia with the patient or authorized representative who has indicated his/her understanding and acceptance.       Plan Discussed with: CRNA, Anesthesiologist and Surgeon  Anesthesia Plan Comments:         Anesthesia Quick Evaluation

## 2019-01-15 ENCOUNTER — Encounter (HOSPITAL_BASED_OUTPATIENT_CLINIC_OR_DEPARTMENT_OTHER): Payer: Self-pay | Admitting: Obstetrics and Gynecology

## 2019-01-16 NOTE — Anesthesia Postprocedure Evaluation (Signed)
Anesthesia Post Note  Patient: [name] [name]  Procedure(s) Performed: ATTEMPTED LAPAROSCOPIC BILATERAL SALPINGO OOPHORECTOMY (Bilateral ) LAPAROTOMY BSO (N/A )     Patient location during evaluation: PACU Anesthesia Type: General Level of consciousness: awake and alert Pain management: pain level controlled Vital Signs Assessment: post-procedure vital signs reviewed and stable Respiratory status: spontaneous breathing, nonlabored ventilation, respiratory function stable and patient connected to nasal cannula oxygen Cardiovascular status: blood pressure returned to baseline and stable Postop Assessment: no apparent nausea or vomiting Anesthetic complications: no    Last Vitals:  Vitals:   01/14/19 1549 01/14/19 1801  BP: (!) 96/57 109/67  Pulse: 73 71  Resp: 16 18  Temp: 37.1 C 36.7 C  SpO2: 95% 97%    Last Pain:  Vitals:   01/14/19 1815  TempSrc:   PainSc: [facility]

## 2020-04-16 ENCOUNTER — Other Ambulatory Visit: Payer: Self-pay

## 2020-04-16 ENCOUNTER — Ambulatory Visit (INDEPENDENT_AMBULATORY_CARE_PROVIDER_SITE_OTHER): Payer: Commercial Managed Care - PPO | Admitting: Family Medicine

## 2020-04-16 VITALS — BP 102/78 | Ht 66.0 in | Wt 170.0 lb

## 2020-04-16 DIAGNOSIS — M25569 Pain in unspecified knee: Secondary | ICD-10-CM | POA: Diagnosis not present

## 2020-04-16 NOTE — Progress Notes (Signed)
  Cade Waldroop is a 54 y.o. female   HPI:  Knee Pain: Patient presents with knee pain involving the  bilateral knee. L > R. Onset of the symptoms was several years ago. Inciting event: none known. Current symptoms include crepitus sensation and pain located lower lateral knee. Pain is aggravated by squatting and going up stairs.  Patient has had no prior knee problems. Evaluation to date: none. Treatment to date: avoidance of offending activity and rest.  Vitals:   04/16/20 0935  BP: 102/78    Bialteral Knee Left Knee: Normal to inspection with no erythema or effusion or obvious bony abnormalities. Palpation normal with no warmth or joint line tenderness., Positive patellar grind test on left  Crepitis and Pain with flexion on left knee ROM normal in flexion and extension and lower leg rotation. Ligaments with solid consistent endpoints including ACL, PCL, LCL, MCL. Negative Mcmurray's and Thessaly meniscal tests. Patellar and quadriceps tendons unremarkable. Hamstring and quadriceps strength is normal. 5/5 strength of ankles bilaterally  A/P Left patellofemoral syndrome.  Mostly with extension, going up stairs. Joint crepitis - quad strengthen exercises - avoid weightloading with full flexion or extension of knee (stair steppers, squats) - OTC topical NSAIDs  - F/u PRN

## 2020-04-16 NOTE — Patient Instructions (Addendum)
Please avoid locking  your leg out on full leg extensions, also avoid stairstepper's and squatting exercises.  You may use over-the-counter diclofenac gel or Aspercreme as needed for pain.  You may use ibuprofen or Aleve as needed as well.

## 2020-04-17 NOTE — Progress Notes (Signed)
[facility] Attending Note: I have seen and examined this patient. I have discussed this patient with the resident and reviewed the assessment and plan as documented above. I agree with the resident's findings and plan.  Classic patellofemoral joint pain.  Her left knee tracks much more laterally than her right.  Patellar grind test very positive on both sides.  She is otherwise ligamentously intact and has normal [place].  Would benefit from VMO strengthening and we will give her those exercises.  I think if she avoids things like squats, full arc leg extensions, stairstepper, step ups and her exercise program she will be fine.  OTC NSAIDs may be helpful as may be OTC Aspercreme.  She can follow-up as needed. We discussed x-rays.  I offered them but also told her I did not think they would be helpful.  She said she did not need any more radiation if it was not going to benefit her so we decided not to pursue x-rays at this time.  I do think most of her symptoms are in the patellofemoral joint.

## 2021-05-04 ENCOUNTER — Other Ambulatory Visit: Payer: Self-pay | Admitting: Family Medicine

## 2021-05-04 DIAGNOSIS — R2231 Localized swelling, mass and lump, right upper limb: Secondary | ICD-10-CM

## 2021-05-26 ENCOUNTER — Other Ambulatory Visit: Payer: Self-pay

## 2021-05-26 ENCOUNTER — Ambulatory Visit
Admission: RE | Admit: 2021-05-26 | Discharge: 2021-05-26 | Disposition: A | Discharge status: Home / Self Care | Payer: Commercial Managed Care - PPO | Source: Ambulatory Visit | Attending: Family Medicine | Admitting: Family Medicine

## 2021-05-26 DIAGNOSIS — R2231 Localized swelling, mass and lump, right upper limb: Secondary | ICD-10-CM

## 2021-06-02 ENCOUNTER — Encounter: Payer: Self-pay | Admitting: Internal Medicine

## 2021-06-09 ENCOUNTER — Encounter: Payer: Self-pay | Admitting: Internal Medicine

## 2021-08-26 ENCOUNTER — Telehealth: Payer: Self-pay | Admitting: *Deleted

## 2021-08-26 ENCOUNTER — Ambulatory Visit (AMBULATORY_SURGERY_CENTER): Payer: Commercial Managed Care - PPO | Admitting: *Deleted

## 2021-08-26 ENCOUNTER — Other Ambulatory Visit: Payer: Self-pay

## 2021-08-26 VITALS — Ht 66.0 in | Wt 170.0 lb

## 2021-08-26 DIAGNOSIS — Z86010 Personal history of colonic polyps: Secondary | ICD-10-CM

## 2021-08-26 MED ORDER — NA SULFATE-K SULFATE-MG SULF 17.5-3.13-1.6 GM/177ML PO SOLN: 1.0000 | ORAL | 0 refills | Status: DC

## 2021-08-26 NOTE — Telephone Encounter (Signed)
Patient no show /did not answer call for PV appointment for today. I called patient, no answer, left a message for the patient to call us back today before 5 pm or the PV and procedure will be cancelled.

## 2021-08-26 NOTE — Progress Notes (Signed)

## 2021-08-26 NOTE — Telephone Encounter (Signed)
PV done.

## 2021-08-30 ENCOUNTER — Encounter: Payer: Self-pay | Admitting: Internal Medicine

## 2021-09-09 ENCOUNTER — Ambulatory Visit (AMBULATORY_SURGERY_CENTER): Payer: Commercial Managed Care - PPO | Admitting: Internal Medicine

## 2021-09-09 ENCOUNTER — Encounter: Payer: Self-pay | Admitting: Internal Medicine

## 2021-09-09 VITALS — BP 145/85 | HR 73 | Temp 98.6°F | Resp 12 | Ht 66.0 in | Wt 170.0 lb

## 2021-09-09 DIAGNOSIS — Z860100 Personal history of colon polyps, unspecified: Secondary | ICD-10-CM

## 2021-09-09 DIAGNOSIS — D125 Benign neoplasm of sigmoid colon: Secondary | ICD-10-CM

## 2021-09-09 DIAGNOSIS — Z1211 Encounter for screening for malignant neoplasm of colon: Secondary | ICD-10-CM

## 2021-09-09 DIAGNOSIS — K635 Polyp of colon: Secondary | ICD-10-CM | POA: Diagnosis not present

## 2021-09-09 DIAGNOSIS — D122 Benign neoplasm of ascending colon: Secondary | ICD-10-CM

## 2021-09-09 MED ORDER — SODIUM CHLORIDE 0.9 % IV SOLN: 500.0000 mL | Freq: Once | INTRAVENOUS | Status: DC

## 2021-09-09 NOTE — Op Note (Signed)
[facility] Patient Name: [name] [name] Procedure Date: 09/09/2021 9:49 AM MRN: [id number] Endoscopist: Docia Chuck. Henrene Pastor , MD Age: 55 Referring MD:  Date of Birth: [birth year] Gender: Female Account #: [id number] Procedure:                Colonoscopy with cold snare polypectomy x 2 Indications:              High risk colon cancer surveillance: Personal                            history of multiple (3 or more) adenomas. Previous                            examination July 2017 Medicines:                Monitored Anesthesia Care Procedure:                Pre-Anesthesia Assessment:                           - Prior to the procedure, a History and Physical                            was performed, and patient medications and                            allergies were reviewed. The patient's tolerance of                            previous anesthesia was also reviewed. The risks                            and benefits of the procedure and the sedation                            options and risks were discussed with the patient.                            All questions were answered, and informed consent                            was obtained. Prior Anticoagulants: The patient has                            taken no previous anticoagulant or antiplatelet                            agents. ASA Grade Assessment: II - A patient with                            mild systemic disease. After reviewing the risks                            and benefits, the patient was deemed in  satisfactory condition to undergo the procedure.                           After obtaining informed consent, the colonoscope                            was passed under direct vision. Throughout the                            procedure, the patient's blood pressure, pulse, and                            oxygen saturations were monitored continuously. The                             Olympus CF-HQ190L [id number]) Colonoscope was                            introduced through the anus and advanced to the the                            cecum, identified by appendiceal orifice and                            ileocecal valve. The ileocecal valve, appendiceal                            orifice, and rectum were photographed. The quality                            of the bowel preparation was excellent. The                            colonoscopy was performed without difficulty. The                            patient tolerated the procedure well. The bowel                            preparation used was SUPREP via split dose                            instruction. Scope In: 10:02:01 AM Scope Out: 10:17:22 AM Scope Withdrawal Time: 0 hours 10 minutes 47 seconds  Total Procedure Duration: 0 hours 15 minutes 21 seconds  Findings:                 Two polyps were found in the sigmoid colon and                            ascending colon. The polyps were 2 to 3 mm in size.                            These polyps were removed with a cold snare.  Resection and retrieval were complete.                           Multiple diverticula were found in the sigmoid                            colon.                           Internal hemorrhoids were found during retroflexion.                           The exam was otherwise without abnormality on                            direct and retroflexion views. Complications:            No immediate complications. Estimated blood loss:                            None. Estimated Blood Loss:     Estimated blood loss: none. Impression:               - Two 2 to 3 mm polyps in the sigmoid colon and in                            the ascending colon, removed with a cold snare.                            Resected and retrieved.                           - Diverticulosis in the sigmoid colon.                           - Internal  hemorrhoids.                           - The examination was otherwise normal on direct                            and retroflexion views. Recommendation:           - Repeat colonoscopy in 5 years for surveillance.                           - Patient has a contact number available for                            emergencies. The signs and symptoms of potential                            delayed complications were discussed with the                            patient. Return to normal activities tomorrow.  Written discharge instructions were provided to the                            patient.                           - Resume previous diet.                           - Continue present medications.                           - Await pathology results. Docia Chuck. Henrene Pastor, MD 09/09/2021 10:22:42 AM This report has been signed electronically.

## 2021-09-09 NOTE — Patient Instructions (Signed)
Repeat Colonoscopy in 5 years for surveillance. Resume previous diet and continue present medications. Awaiting pathology results.  YOU HAD AN ENDOSCOPIC PROCEDURE TODAY AT [facility]:   Refer to the procedure report that was given to you for any specific questions about what was found during the examination.  If the procedure report does not answer your questions, please call your gastroenterologist to clarify.  If you requested that your care partner not be given the details of your procedure findings, then the procedure report has been included in a sealed envelope for you to review at your convenience later.  YOU SHOULD EXPECT: Some feelings of bloating in the abdomen. Passage of more gas than usual.  Walking can help get rid of the air that was put into your GI tract during the procedure and reduce the bloating. If you had a lower endoscopy (such as a colonoscopy or flexible sigmoidoscopy) you may notice spotting of blood in your stool or on the toilet paper. If you underwent a bowel prep for your procedure, you may not have a normal bowel movement for a few days.  Please Note:  You might notice some irritation and congestion in your nose or some drainage.  This is from the oxygen used during your procedure.  There is no need for concern and it should clear up in a day or so.  SYMPTOMS TO REPORT IMMEDIATELY:  Following lower endoscopy (colonoscopy or flexible sigmoidoscopy):  Excessive amounts of blood in the stool  Significant tenderness or worsening of abdominal pains  Swelling of the abdomen that is new, acute  Fever of 100F or higher  For urgent or emergent issues, a gastroenterologist can be reached at any hour by calling [phone]. Do not use MyChart messaging for urgent concerns.    DIET:  We do recommend a small meal at first, but then you may proceed to your regular diet.  Drink plenty of fluids but you should avoid alcoholic beverages for 24  hours.  ACTIVITY:  You should plan to take it easy for the rest of today and you should NOT DRIVE or use heavy machinery until tomorrow (because of the sedation medicines used during the test).    FOLLOW UP: Our staff will call the number listed on your records 48-72 hours following your procedure to check on you and address any questions or concerns that you may have regarding the information given to you following your procedure. If we do not reach you, we will leave a message.  We will attempt to reach you two times.  During this call, we will ask if you have developed any symptoms of COVID 19. If you develop any symptoms (ie: fever, flu-like symptoms, shortness of breath, cough etc.) before then, please call [phone].  If you test positive for Covid 19 in the 2 weeks post procedure, please call and report this information to [place].    If any biopsies were taken you will be contacted by phone or by letter within the next 1-3 weeks.  Please call us at [phone] if you have not heard about the biopsies in 3 weeks.    SIGNATURES/CONFIDENTIALITY: You and/or your care partner have signed paperwork which will be entered into your electronic medical record.  These signatures attest to the fact that that the information above on your After Visit Summary has been reviewed and is understood.  Full responsibility of the confidentiality of this discharge information lies with you and/or your care-partner.

## 2021-09-09 NOTE — Progress Notes (Signed)
Pt's states no medical or surgical changes since previsit or office visit. 

## 2021-09-09 NOTE — Progress Notes (Signed)
Sedate, gd SR, tolerated procedure well, VSS, report to RN 

## 2021-09-09 NOTE — Progress Notes (Signed)
Called to room to assist during endoscopic procedure.  Patient ID and intended procedure confirmed with present staff. Received instructions for my participation in the procedure from the performing physician.  

## 2021-09-13 ENCOUNTER — Telehealth: Payer: Self-pay

## 2021-09-13 ENCOUNTER — Telehealth: Payer: Self-pay | Admitting: *Deleted

## 2021-09-13 NOTE — Telephone Encounter (Signed)
  Follow up Call-  Call back number 09/09/2021  Post procedure Call Back phone  # [phone]  Permission to leave phone message Yes  Some recent data might be hidden     Patient questions:  Do you have a fever, pain , or abdominal swelling? No. Pain Score  0 *  Have you tolerated food without any problems? Yes.    Have you been able to return to your normal activities? Yes.    Do you have any questions about your discharge instructions: Diet   No. Medications  No. Follow up visit  No.  Do you have questions or concerns about your Care? No.  Actions: * If pain score is 4 or above: No action needed, pain <4.

## 2021-09-13 NOTE — Telephone Encounter (Signed)
First attempt, left VM.  

## 2021-09-15 ENCOUNTER — Encounter: Payer: Self-pay | Admitting: Internal Medicine

## 2021-09-23 ENCOUNTER — Other Ambulatory Visit: Payer: Self-pay | Admitting: Nurse Practitioner

## 2021-09-23 DIAGNOSIS — K529 Noninfective gastroenteritis and colitis, unspecified: Secondary | ICD-10-CM

## 2021-09-23 DIAGNOSIS — K5792 Diverticulitis of intestine, part unspecified, without perforation or abscess without bleeding: Secondary | ICD-10-CM

## 2021-09-23 DIAGNOSIS — R109 Unspecified abdominal pain: Secondary | ICD-10-CM

## 2021-10-11 ENCOUNTER — Other Ambulatory Visit: Payer: Commercial Managed Care - PPO

## 2021-11-12 IMAGING — US US AXILLARY RIGHT
1 series · 4 of 4 positions shown · non-contrast
Comparison: Previous exam(s).
COMPARISON: Previous exam(s).

Addendum:
CLINICAL DATA: Pain and fullness in the right axilla

EXAM:
DIGITAL DIAGNOSTIC UNILATERAL RIGHT MAMMOGRAM WITH TOMOSYNTHESIS AND
CAD
TECHNIQUE: Right digital diagnostic mammography and breast tomosynthesis was
performed. The images were evaluated with computer-aided detection.

[Series 1: us axillary right · 0.08mm/px · 4 of 4 slices shown]
[im 1/4]
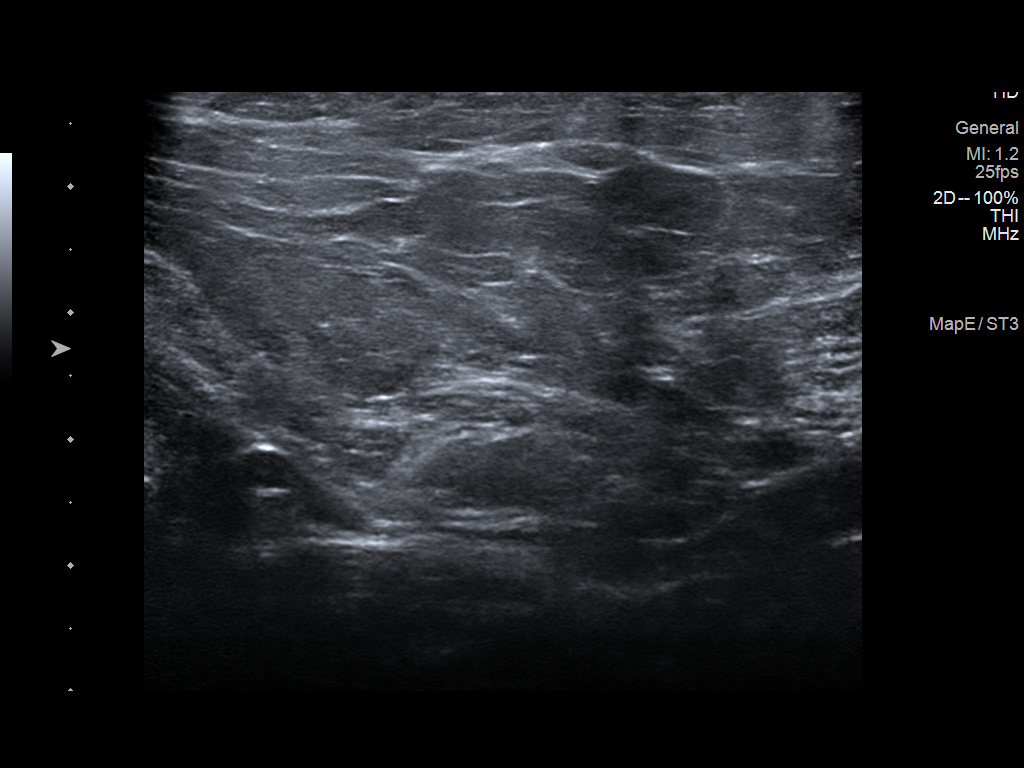
[im 2/4]
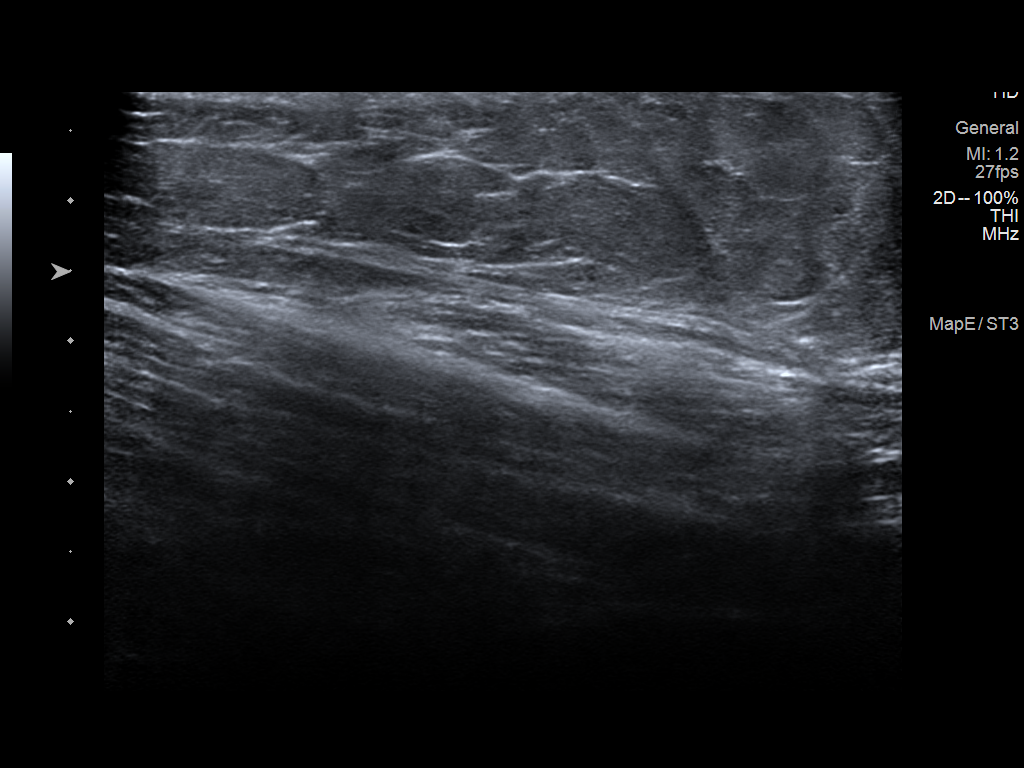
[im 3/4]
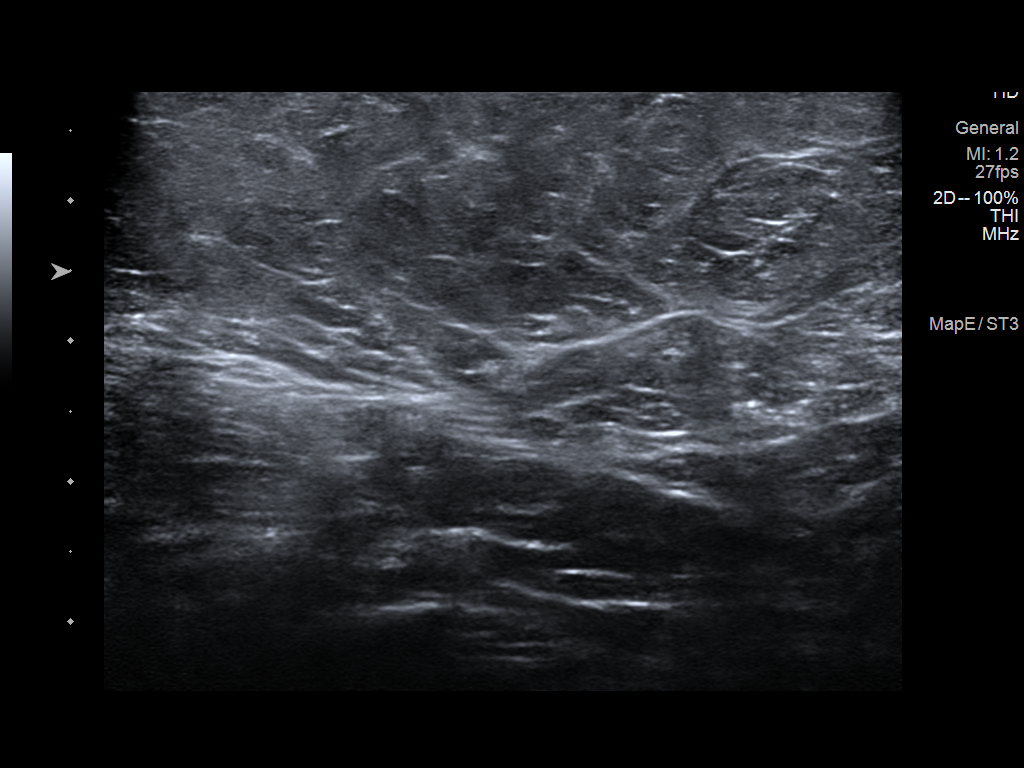
[im 4/4]
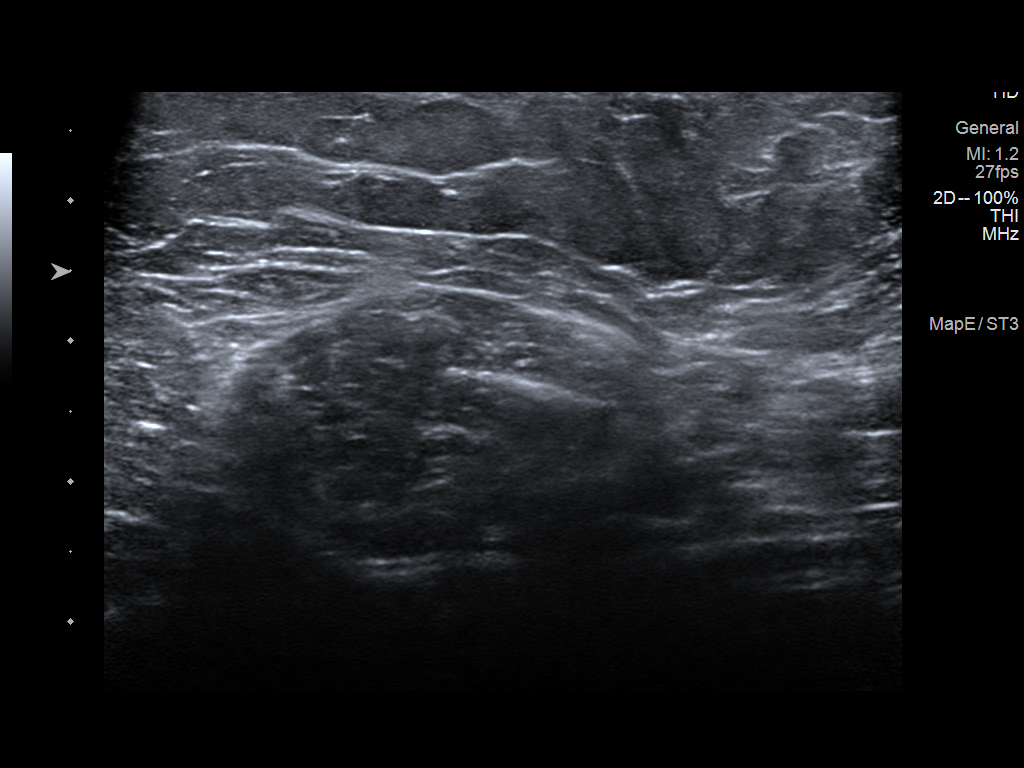

[4 of 4 positions shown; findings below may reference images not displayed]

ACR Breast Density Category b: There are scattered areas of
fibroglandular density.
FINDINGS: No suspicious masses, calcifications, or distortion are seen
mammographically.

On physical exam, no suspicious lumps are identified.

Targeted ultrasound is performed, showing normal tissue in the right
axilla.
IMPRESSION: No mammographic or sonographic evidence of malignancy.

RECOMMENDATION:
Treatment of the patient's symptoms should be based on clinical and
physical exam given lack of imaging findings. Recommend annual
screening mammography.

I have discussed the findings and recommendations with the patient.
If applicable, a reminder letter will be sent to the patient
regarding the next appointment.

BI-RADS CATEGORY  1: Negative.

ADDENDUM:
A right axillary ultrasound was also performed.

*** End of Addendum ***
ACR Breast Density Category b: There are scattered areas of
fibroglandular density.
FINDINGS: No suspicious masses, calcifications, or distortion are seen
mammographically.

On physical exam, no suspicious lumps are identified.

Targeted ultrasound is performed, showing normal tissue in the right
axilla.
IMPRESSION: No mammographic or sonographic evidence of malignancy.

RECOMMENDATION:
Treatment of the patient's symptoms should be based on clinical and
physical exam given lack of imaging findings. Recommend annual
screening mammography.

I have discussed the findings and recommendations with the patient.
If applicable, a reminder letter will be sent to the patient
regarding the next appointment.

BI-RADS CATEGORY  1: Negative.

## 2021-11-12 IMAGING — MG MM DIGITAL DIAGNOSTIC UNILAT*R* W/ TOMO W/ CAD
2 series · 3 of 6 positions shown · non-contrast
Comparison: Previous exam(s).
COMPARISON: Previous exam(s).

Addendum:
CLINICAL DATA: Pain and fullness in the right axilla

EXAM:
DIGITAL DIAGNOSTIC UNILATERAL RIGHT MAMMOGRAM WITH TOMOSYNTHESIS AND
CAD
TECHNIQUE: Right digital diagnostic mammography and breast tomosynthesis was
performed. The images were evaluated with computer-aided detection.

[R MLO synth-2D]
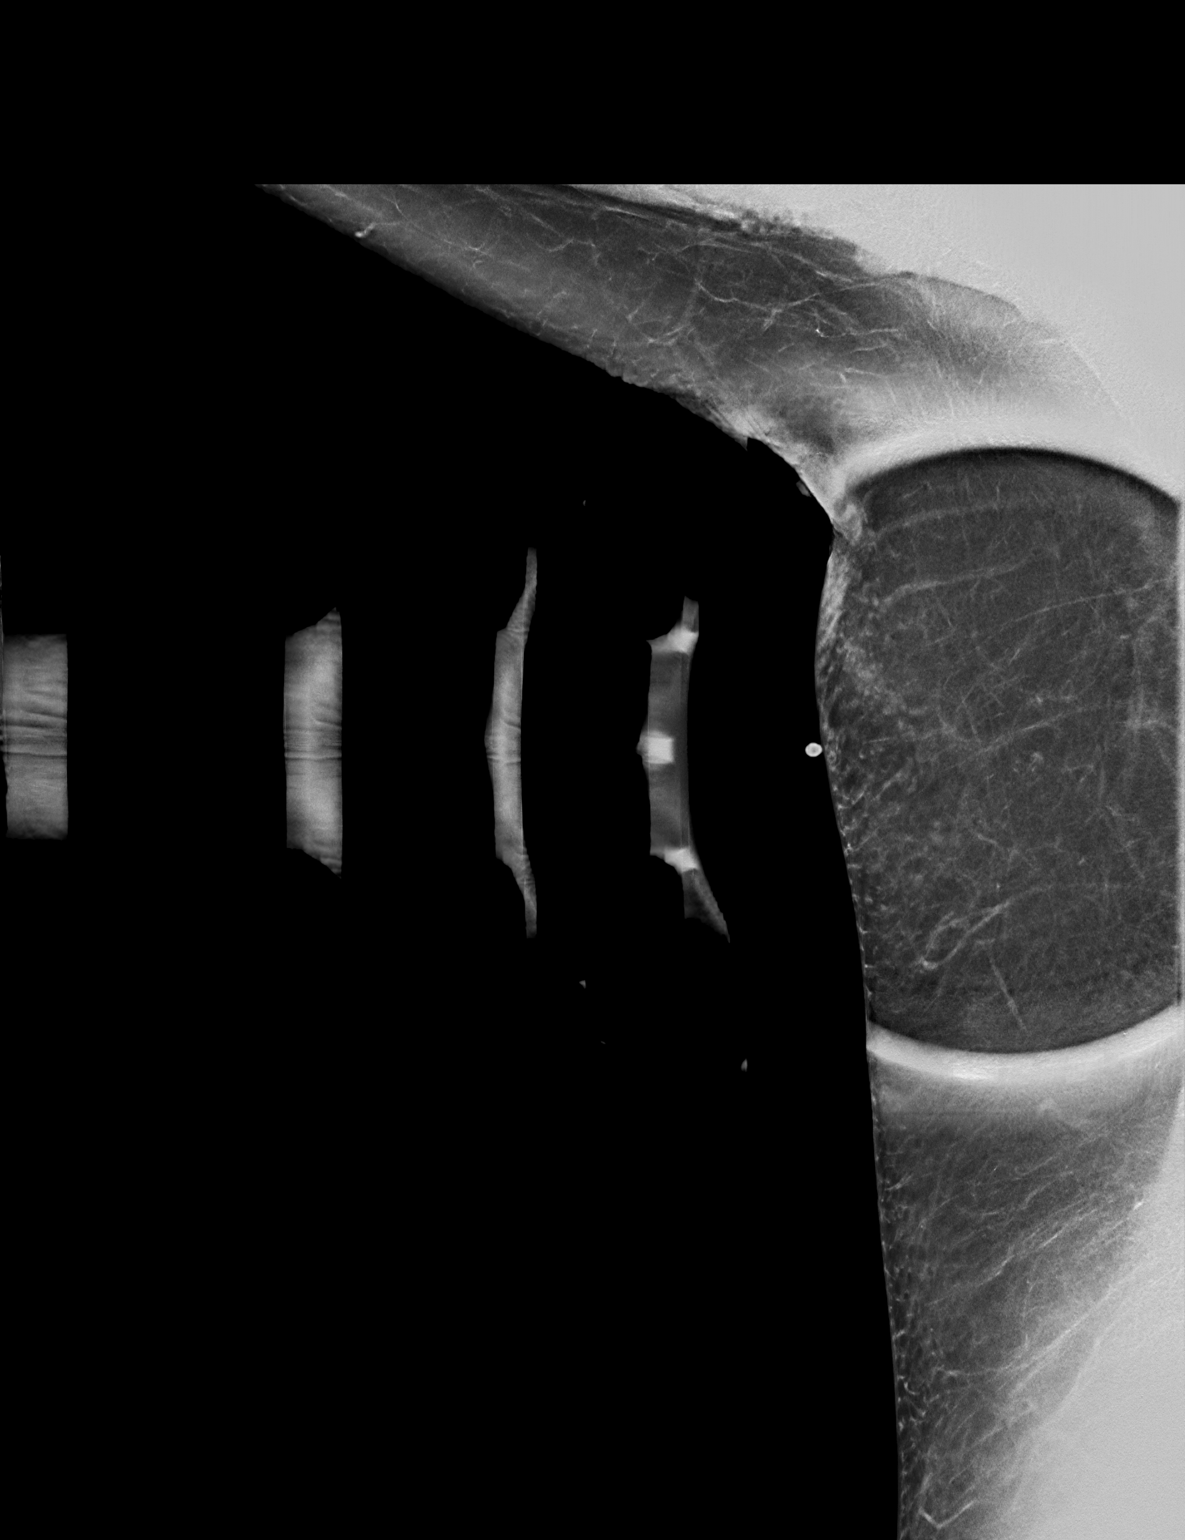

[R MLO tomo · 2 of 60 frames shown]
[frame 20/60]
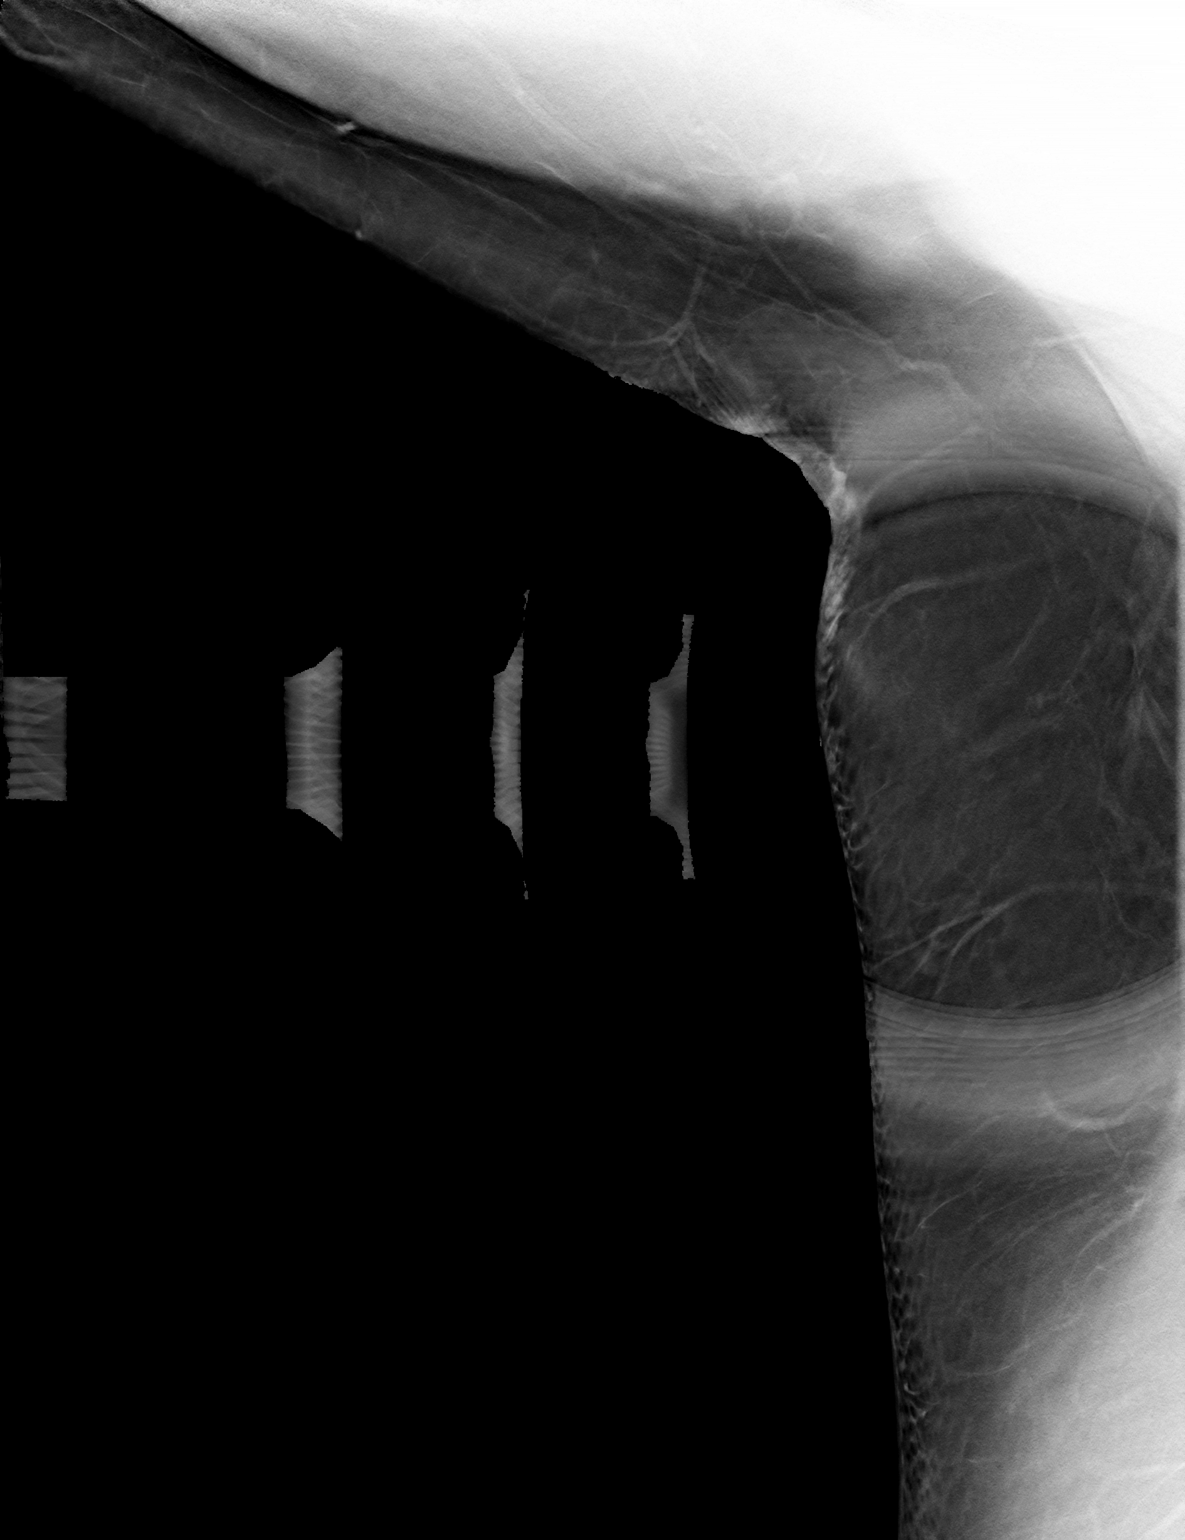
[frame 31/60]
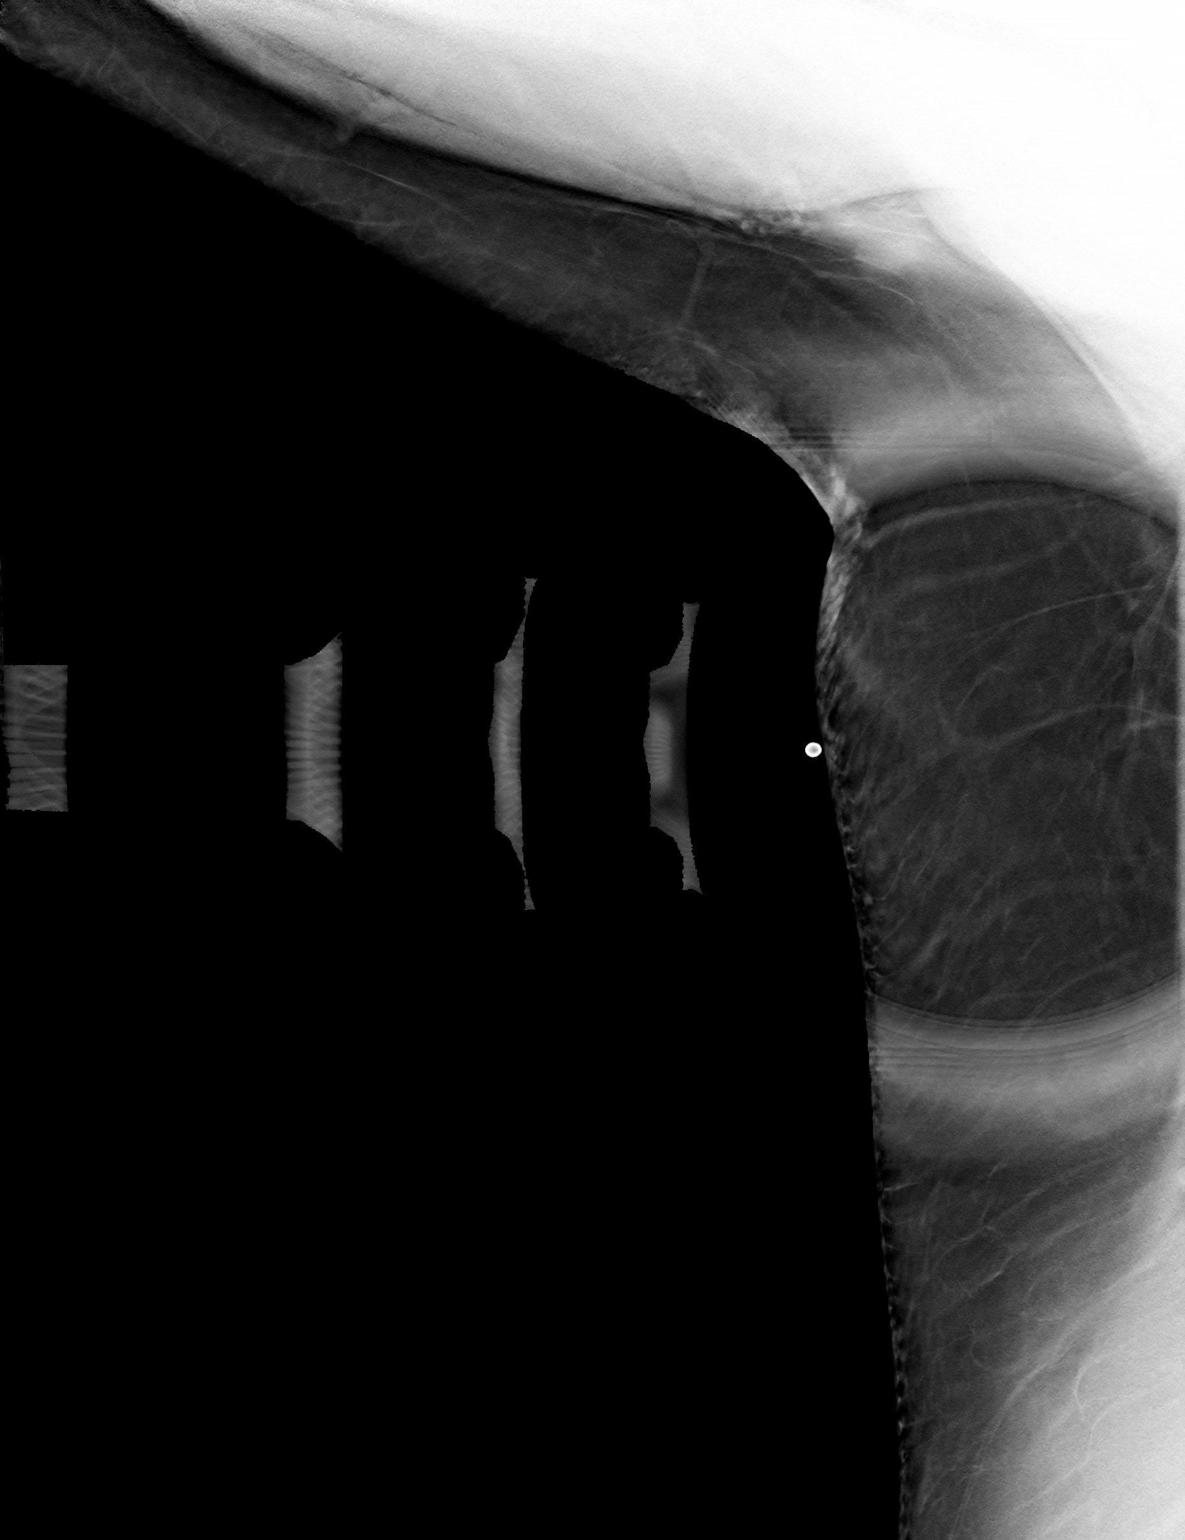

[3 of 6 positions shown; findings below may reference images not displayed]

ACR Breast Density Category b: There are scattered areas of
fibroglandular density.
FINDINGS: No suspicious masses, calcifications, or distortion are seen
mammographically.

On physical exam, no suspicious lumps are identified.

Targeted ultrasound is performed, showing normal tissue in the right
axilla.
IMPRESSION: No mammographic or sonographic evidence of malignancy.

RECOMMENDATION:
Treatment of the patient's symptoms should be based on clinical and
physical exam given lack of imaging findings. Recommend annual
screening mammography.

I have discussed the findings and recommendations with the patient.
If applicable, a reminder letter will be sent to the patient
regarding the next appointment.

BI-RADS CATEGORY  1: Negative.

ADDENDUM:
A right axillary ultrasound was also performed.

*** End of Addendum ***
ACR Breast Density Category b: There are scattered areas of
fibroglandular density.
FINDINGS: No suspicious masses, calcifications, or distortion are seen
mammographically.

On physical exam, no suspicious lumps are identified.

Targeted ultrasound is performed, showing normal tissue in the right
axilla.
IMPRESSION: No mammographic or sonographic evidence of malignancy.

RECOMMENDATION:
Treatment of the patient's symptoms should be based on clinical and
physical exam given lack of imaging findings. Recommend annual
screening mammography.

I have discussed the findings and recommendations with the patient.
If applicable, a reminder letter will be sent to the patient
regarding the next appointment.

BI-RADS CATEGORY  1: Negative.

## 2022-05-09 ENCOUNTER — Ambulatory Visit (INDEPENDENT_AMBULATORY_CARE_PROVIDER_SITE_OTHER): Payer: Commercial Managed Care - PPO

## 2022-05-09 ENCOUNTER — Ambulatory Visit (INDEPENDENT_AMBULATORY_CARE_PROVIDER_SITE_OTHER): Payer: Commercial Managed Care - PPO | Admitting: Orthopedic Surgery

## 2022-05-09 DIAGNOSIS — M79671 Pain in right foot: Secondary | ICD-10-CM

## 2022-05-09 DIAGNOSIS — S92024A Nondisplaced fracture of anterior process of right calcaneus, initial encounter for closed fracture: Secondary | ICD-10-CM

## 2022-05-09 DIAGNOSIS — S93492A Sprain of other ligament of left ankle, initial encounter: Secondary | ICD-10-CM

## 2022-05-18 ENCOUNTER — Encounter: Payer: Self-pay | Admitting: Orthopedic Surgery

## 2022-06-12 ENCOUNTER — Ambulatory Visit (INDEPENDENT_AMBULATORY_CARE_PROVIDER_SITE_OTHER): Payer: Commercial Managed Care - PPO

## 2022-06-12 ENCOUNTER — Ambulatory Visit (INDEPENDENT_AMBULATORY_CARE_PROVIDER_SITE_OTHER): Payer: Commercial Managed Care - PPO | Admitting: Orthopedic Surgery

## 2022-06-12 DIAGNOSIS — S92024A Nondisplaced fracture of anterior process of right calcaneus, initial encounter for closed fracture: Secondary | ICD-10-CM

## 2022-06-23 ENCOUNTER — Ambulatory Visit (INDEPENDENT_AMBULATORY_CARE_PROVIDER_SITE_OTHER): Payer: Commercial Managed Care - PPO | Admitting: Allergy

## 2022-06-23 ENCOUNTER — Encounter: Payer: Self-pay | Admitting: Allergy

## 2022-06-23 VITALS — BP 134/80 | HR 68 | Temp 97.8°F | Ht 66.0 in | Wt 180.0 lb

## 2022-06-23 DIAGNOSIS — J300 Vasomotor rhinitis: Secondary | ICD-10-CM

## 2022-06-23 DIAGNOSIS — T781XXA Other adverse food reactions, not elsewhere classified, initial encounter: Secondary | ICD-10-CM

## 2022-06-23 DIAGNOSIS — T781XXD Other adverse food reactions, not elsewhere classified, subsequent encounter: Secondary | ICD-10-CM

## 2022-06-23 DIAGNOSIS — J4520 Mild intermittent asthma, uncomplicated: Secondary | ICD-10-CM | POA: Diagnosis not present

## 2022-06-23 DIAGNOSIS — K219 Gastro-esophageal reflux disease without esophagitis: Secondary | ICD-10-CM | POA: Diagnosis not present

## 2022-06-23 MED ORDER — IPRATROPIUM BROMIDE 0.06 % NA SOLN: NASAL | 2 refills | Status: DC

## 2022-06-23 NOTE — Progress Notes (Signed)
New Patient Note  RE: [name] [name] MRN: [id number] DOB: 1965/12/08 Date of Office Visit: 06/23/2022  Primary care provider: Fanny Bien, MD  Chief Complaint: allergy testing  History of present illness: [name] [name] is a 56 y.o. female presenting today for ?food allergy.   She has noted a pattern when she eats bread sometimes her nose gets really congested with goes away in about a hour.   She also reports then getting nauseated and having diarrhea after the congestion starts.   This also happens with alcohol consumption with wine, beer and liquors with wine causing worse symptoms.  She states she doesn't really drink alcohol much anyway as can cause migraine.  She states she had worse symptoms when she had bread that had variety of types of wheat.  She feels like she does fine with breads like english muffin.  She states usually if she is eating bread with butter and sometimes tomatoes.  She states years ago when eating grapefruit her lips would get tingly and hurt.   She had testing for grapefruit years ago in early 2000s.  She has avoided since.    She states she saw a doctor a few years ago who told her she had asthma but she has never had a asthma attack.  She does have occasional wheeze but feels it is more so related to reflux.  She does have an albuterol inhaler.  When the reflux is controlled with Prilosec she does not have any respiratory symptoms.  No history of asthma.  Denies any symptoms consistent with allergic rhinitis or conjunctivitisAt this time. She was seen in our office by Dr. Neldon Mc back in January 2017 for evaluation of allergic rhinoconjunctivitis and reflux.  She had testing done that was positive to dust mites.   Review of systems: Review of Systems  Constitutional: Negative.   HENT:         See HPI  Eyes: Negative.   Respiratory: Negative.    Cardiovascular: Negative.   Gastrointestinal:        See HPI  Musculoskeletal: Negative.   Skin:  Negative.   Allergic/Immunologic: Negative.   Neurological: Negative.     All other systems negative unless noted above in HPI  Past medical history: Past Medical History:  Diagnosis Date   Anemia    Asthma    Blood transfusion without reported diagnosis    GERD (gastroesophageal reflux disease)    Kidney stones    Migraine     Past surgical history: Past Surgical History:  Procedure Laterality Date   COLONOSCOPY  06/14/2016   Dr.Perry   LAPAROSCOPIC BILATERAL SALPINGO OOPHERECTOMY Bilateral 01/14/2019   Procedure: ATTEMPTED LAPAROSCOPIC BILATERAL SALPINGO OOPHORECTOMY;  Surgeon: Charyl Bigger, MD;  Location: [facility];  Service: Gynecology;  Laterality: Bilateral;  Requests 2 hrs.   LAPAROTOMY N/A 01/14/2019   Procedure: LAPAROTOMY BSO;  Surgeon: Charyl Bigger, MD;  Location: [facility];  Service: Gynecology;  Laterality: N/A;   POLYPECTOMY     SPLENECTOMY     TUBAL LIGATION     UTERINE FIBROID SURGERY      Family history:  Family History  Problem Relation Age of Onset   Colon polyps Mother    Colon polyps Father    Cancer - Other Father        oral cancer   Lung cancer Maternal Uncle    Colon cancer Maternal Uncle 59   Lung cancer Maternal Grandmother  Esophageal cancer Neg Hx    Rectal cancer Neg Hx    Stomach cancer Neg Hx     Social history: Lives in a home without carpeting with gas heating and central cooling.  Dog and cats in the home.  There is no concern for linear dimension, mildew or roaches in the home.  She is a Technical sales engineer.  She has no smoke exposure.   Medication List: Current Outpatient Medications  Medication Sig Dispense Refill   ALPRAZolam (XANAX) 0.5 MG tablet Take 0.5 mg by mouth daily as needed for anxiety.     ipratropium (ATROVENT) 0.06 % nasal spray Place 2 spray each nostril 15-20 minutes prior to eating breads/alcohol 15 mL 2   MELATONIN PO Take by mouth.     Na Sulfate-K Sulfate-Mg Sulf  17.5-3.13-1.6 GM/177ML SOLN Take 1 kit by mouth as directed. May use generic Suprep 354 mL 0   omeprazole (PRILOSEC) 40 MG capsule Take 40 mg by mouth daily.     rosuvastatin (CRESTOR) 5 MG tablet Take 5 mg by mouth at bedtime.     sertraline (ZOLOFT) 50 MG tablet Take 75 mg by mouth daily.     SUMAtriptan (IMITREX) 100 MG tablet Take 50 mg by mouth every 2 (two) hours as needed. For migraine     No current facility-administered medications for this visit.    Known medication allergies: Allergies  Allergen Reactions   Grapefruit Extract     Results of an allergy test   Vicodin [Hydrocodone-Acetaminophen] Swelling    rash   Penicillins Swelling and Rash     Physical examination: Blood pressure 134/80, pulse 68, temperature 97.8 F (36.6 C), height 5' 6"  (1.676 m), weight 180 lb (81.6 kg), last menstrual period 10/04/2015, SpO2 97 %.  General: Alert, interactive, in no acute distress. HEENT: PERRLA, TMs pearly gray, turbinates non-edematous without discharge, post-pharynx non erythematous. Neck: Supple without lymphadenopathy. Lungs: Clear to auscultation without wheezing, rhonchi or rales. {no increased work of breathing. CV: Normal S1, S2 without murmurs. Abdomen: Nondistended, nontender. Skin: Warm and dry, without lesions or rashes. Extremities:  No clubbing, cyanosis or edema. Neuro:   Grossly intact.  Diagnositics/Labs:  Allergy testing:   Food Adult Perc - 06/23/22 1000     Time Antigen Placed 1015    Allergen Manufacturer Lavella Hammock    Location Back    Number of allergen test 8    Comments Non-reactive             Allergy testing results were read and interpreted by provider, documented by clinical staff.   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  - if labs return positive then will provide you with an epinephrine device.    Reflux Reflux driven reactive airway - continue use of Prilosec for reflux control - have access to albuterol inhaler 2 puffs every 4-6 hours as needed for cough/wheeze/shortness of breath/chest tightness.  Monitor frequency of use.  Follow-up pending labs  I appreciate the opportunity to take part in [facility] care. Please do not hesitate to contact me with questions.  Sincerely,   Prudy Feeler, MD Allergy/Immunology [facility]

## 2022-06-23 NOTE — Patient Instructions (Addendum)
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  - if labs return positive then will provide you with an epinephrine device.    Reflux Reflux driven reactive airway - continue use of Prilosec for reflux control - have access to albuterol inhaler 2 puffs every 4-6 hours as needed for cough/wheeze/shortness of breath/chest tightness.  Monitor frequency of use.     Follow-up pending labs

## 2022-06-25 LAB — ALLERGEN, GRAPEFRUIT, F209

## 2022-06-25 LAB — ALLERGEN, WHEAT, F4

## 2022-06-26 ENCOUNTER — Encounter: Payer: Self-pay | Admitting: Orthopedic Surgery

## 2022-06-26 NOTE — Progress Notes (Signed)
Office Visit Note   Patient: [name] [name]           Date of Birth: 02-10-1966           MRN: [id number] Visit Date: 06/12/2022              Requested by: Fanny Bien, MD [address] [place],  [place] [address] PCP: Fanny Bien, MD  Chief Complaint  Patient presents with   Right Foot - Fracture, Follow-up      HPI: Patient is a 56 year old woman status post right foot injury on 05/05/2022.  Patient has been in a fracture boot on the right and a ASO on the left.  She is 6 weeks out from her initial injury.  Assessment & Plan: Visit Diagnoses:  1. Closed nondisplaced fracture of anterior process of right calcaneus, initial encounter     Plan: Patient will increase her activities as tolerated wean out of the fracture boot and ASO.  Follow-Up Instructions: Return if symptoms worsen or fail to improve.   Ortho Exam  Patient is alert, oriented, no adenopathy, well-dressed, normal affect, normal respiratory effort. Examination patient has good pain-free range of motion of the ankle and subtalar joint.  There is no redness or cellulitis.  Anterior drawer is stable.  Imaging: No results found. No images are attached to the encounter.  Labs: Lab Results  Component Value Date   REPTSTATUS 10/17/2018 FINAL 10/16/2018   CULT  10/16/2018    NO GROWTH Performed at [facility] [address] [place],  [address]      Lab Results  Component Value Date   ALBUMIN 4.2 06/08/2018    No results found for: "MG" No results found for: "VD25OH"  No results found for: "PREALBUMIN"    Latest Ref Rng & Units 01/14/2019    6:07 AM 06/08/2018   10:23 PM 04/14/2011    8:16 AM  CBC EXTENDED  WBC 4.0 - 10.5 K/uL 7.9  11.2    RBC 3.87 - 5.11 MIL/uL 4.49  4.65    Hemoglobin 12.0 - 15.0 g/dL 14.0  14.3  13.8   HCT 36.0 - 46.0 % 42.6  43.7    Platelets 150 - 400 K/uL 400  389       There is no height or weight on file to calculate BMI.  Orders:   Orders Placed This Encounter  Procedures   XR Foot Complete Right   No orders of the defined types were placed in this encounter.    Procedures: No procedures performed  Clinical Data: No additional findings.  ROS:  All other systems negative, except as noted in the HPI. Review of Systems  Objective: Vital Signs: LMP 10/04/2015   Specialty Comments:  No specialty comments available.  PMFS History: Patient Active Problem List   Diagnosis Date Noted   Ovarian mass 01/14/2019   Past Medical History:  Diagnosis Date   Anemia    Asthma    Blood transfusion without reported diagnosis    GERD (gastroesophageal reflux disease)    Kidney stones    Migraine     Family History  Problem Relation Age of Onset   Colon polyps Mother    Colon polyps Father    Cancer - Other Father        oral cancer   Lung cancer Maternal Uncle    Colon cancer Maternal Uncle 49   Lung cancer Maternal Grandmother    Esophageal cancer Neg Hx  Rectal cancer Neg Hx    Stomach cancer Neg Hx     Past Surgical History:  Procedure Laterality Date   COLONOSCOPY  06/14/2016   Dr.Perry   LAPAROSCOPIC BILATERAL SALPINGO OOPHERECTOMY Bilateral 01/14/2019   Procedure: ATTEMPTED LAPAROSCOPIC BILATERAL SALPINGO OOPHORECTOMY;  Surgeon: Charyl Bigger, MD;  Location: [facility];  Service: Gynecology;  Laterality: Bilateral;  Requests 2 hrs.   LAPAROTOMY N/A 01/14/2019   Procedure: LAPAROTOMY BSO;  Surgeon: Charyl Bigger, MD;  Location: [facility];  Service: Gynecology;  Laterality: N/A;   POLYPECTOMY     SPLENECTOMY     TUBAL LIGATION     UTERINE FIBROID SURGERY     Social History   Occupational History   Occupation: Architectural technologist: [employer]  Tobacco Use   Smoking status: Never   Smokeless tobacco: Never  Vaping Use   Vaping Use: Never used  Substance and Sexual Activity   Alcohol use: Yes    Alcohol/week: 1.0 standard drink of  alcohol    Types: 1 Standard drinks or equivalent per week   Drug use: No   Sexual activity: Yes    Birth control/protection: Surgical

## 2022-06-27 LAB — ALLERGEN,OAT,F7: Allergen Oat IgE: <0.1 kU/L

## 2022-06-27 LAB — TRYPTASE: Tryptase: 17.3 ug/L — ABNORMAL HIGH (ref 2.2–13.2)

## 2022-06-27 LAB — ALLERGEN, WHITE POTATO,F35: Allergen Potato, White IgE: <0.1 kU/L

## 2022-06-27 LAB — ALLERGEN, HOPS/FRUIT CONE, IGE: Hops: <0.1 kU/L

## 2022-06-27 LAB — ALLERGEN BARLEY F6: Allergen Barley IgE: <0.1 kU/L

## 2022-06-27 LAB — ALLERGEN, RYE, F5: Rye IgE: <0.1 kU/L

## 2022-06-27 LAB — ALLERGEN, BAKERS YEAST, F45: F045-IgE Yeast: <0.1 kU/L

## 2022-07-07 ENCOUNTER — Telehealth: Payer: Self-pay

## 2022-07-07 NOTE — Telephone Encounter (Signed)
Patient called in - DOB verified - requesting lab results from 06/23/22. Lab results resulted on 06/27/22.  Patient advised once provider has reviewed all lab results and makes recommendations, our office will contact her regarding the results and provider notation. Patient advised she will see provider notation via myChart as well.  Patient verbalized understanding, no further questions.  Forwarding message to provider.

## 2022-07-12 ENCOUNTER — Telehealth: Payer: Self-pay | Admitting: Allergy

## 2022-07-12 NOTE — Telephone Encounter (Signed)
Patient called to get results of labs [phone]

## 2022-07-13 NOTE — Telephone Encounter (Signed)
Reviewed the results. All of the foods that we sent are all negative, which is good news. She should be safe to eat them.   However, her tryptase was elevated slightly. This is a marker of mast cell disease. It can also be elevated DURING an allergic reaction. I would like to get a repeat one. I ordered this. She can go by our office or another [facility] to get this drawn.   We will see what the repeat number is before determining next steps.   Salvatore Marvel, MD [facility]

## 2022-07-13 NOTE — Telephone Encounter (Signed)
Called patient - DOB verified - advised lab results have not been reviewed - I will forward the lab results to a provider to review the lab results, once they have had a chance to review them, make recommendations for next step, she will be contacted via myChart or telephone call.  Patient verbalized understanding, no further questions.

## 2022-07-13 NOTE — Telephone Encounter (Signed)
Called patient back - DOB verified - advised of lab results and provider notation below.   Patient verbalized understanding, no further questions.

## 2022-07-16 LAB — TRYPTASE: Tryptase: 20.3 ug/L — ABNORMAL HIGH (ref 2.2–13.2)

## 2022-07-19 ENCOUNTER — Other Ambulatory Visit: Payer: Self-pay

## 2022-07-19 MED ORDER — EPINEPHRINE 0.3 MG/0.3ML IJ SOAJ: 0.3000 mg | Freq: Once | INTRAMUSCULAR | 1 refills | Status: AC

## 2022-07-21 ENCOUNTER — Other Ambulatory Visit: Payer: Self-pay | Admitting: Allergy

## 2022-07-21 ENCOUNTER — Encounter: Payer: Self-pay | Admitting: *Deleted

## 2022-07-21 DIAGNOSIS — R748 Abnormal levels of other serum enzymes: Secondary | ICD-10-CM

## 2022-07-21 NOTE — Progress Notes (Signed)
MyChart message sent to [place] with this information.

## 2022-07-21 NOTE — Progress Notes (Signed)
Will move forward with further workup for elevated tryptase level.  This additional work-up will help differentiate if this could be mastocytosis.  If labs are positive then she will need referral for formal diagnosis.  If labs are negative then she may have hereditary alpha-tryptasemia which to diagnose or require a specific test for this which is an out-of-pocket cost for the patient if she would like to have this done pending results of the next step testing.  I have ordered the next step labs.

## 2022-08-11 ENCOUNTER — Telehealth: Payer: Self-pay

## 2022-08-11 NOTE — Telephone Encounter (Signed)
Per provider - called patient - DOB verified - advised lab orders are already in system so she can come any time to have lab work done - make sure to let front staff know she's here for labs only.  Patient advised of the office new moving dates as well.  Patient verbalized understanding, no further questions.

## 2022-08-15 LAB — N-METHYLHISTAMINE, 24 HR, U

## 2022-08-21 LAB — N-METHYLHISTAMINE,RANDOM,U
Creatinine,Random,U: 49 mg/dL (ref 16–326)
N-Methylhistamine,Random,U: 192 mcg/g Cr (ref 30–200)

## 2022-10-02 ENCOUNTER — Other Ambulatory Visit: Payer: Self-pay | Admitting: Allergy

## 2022-10-24 NOTE — Addendum Note (Signed)
Addended by: Valentina Shaggy on: 10/24/2022 04:09 PM   Modules accepted: Orders

## 2023-08-02 ENCOUNTER — Ambulatory Visit (INDEPENDENT_AMBULATORY_CARE_PROVIDER_SITE_OTHER): Payer: BC Managed Care – PPO | Admitting: Family Medicine

## 2023-08-02 ENCOUNTER — Encounter: Payer: Self-pay | Admitting: Family Medicine

## 2023-08-02 ENCOUNTER — Other Ambulatory Visit: Payer: Self-pay

## 2023-08-02 VITALS — BP 104/60 | HR 103 | Temp 98.0°F | Resp 16 | Wt 184.2 lb

## 2023-08-02 DIAGNOSIS — K219 Gastro-esophageal reflux disease without esophagitis: Secondary | ICD-10-CM | POA: Diagnosis not present

## 2023-08-02 DIAGNOSIS — J4520 Mild intermittent asthma, uncomplicated: Secondary | ICD-10-CM

## 2023-08-02 DIAGNOSIS — J3089 Other allergic rhinitis: Secondary | ICD-10-CM | POA: Diagnosis not present

## 2023-08-02 DIAGNOSIS — R748 Abnormal levels of other serum enzymes: Secondary | ICD-10-CM | POA: Diagnosis not present

## 2023-08-02 NOTE — Patient Instructions (Addendum)
Reactive airway disease Well controlled Continue albuterol 2 puffs once every 4 hours as needed for cough or wheeze You may use albuterol 2 puffs 5 to 15 minutes before activity to decrease cough or wheeze  Reflux Moderately well controlled Continue dietary and lifestyle modifications as listed below Continue omeprazole 20 mg once a day for the next 30 days and note if wheezing at night resolves  Elevated tryptase Not well controlled We have placed some labs to help [place] evaluate your tryptase level. We will call you when the results become available. We will call you when the appropriate 24-hour urine tests have been ordered. You have been given an order to check your tryptase during a reaction. You may go to any [facility] to get this lab drawn during a reaction Begin a journal of events that occurred for up to 6 hours before your symptoms began including foods and beverages consumed, soaps or perfumes you had contact with, and medications.   Allergic rhinitis Moderately well controlled Continue allergen avoidance measures directed toward dust mites as listed below Continue an antihistamine as needed for a runny nose or itch Consider saline nasal rinses as needed for nasal symptoms. Use this before any medicated nasal sprays for best result  Call the clinic if this treatment plan is not working well for you  Follow up in 1 month or sooner if needed.   Control of Dust Mite Allergen Dust mites play a major role in allergic asthma and rhinitis. They occur in environments with high humidity wherever human skin is found. Dust mites absorb humidity from the atmosphere (ie, they do not drink) and feed on organic matter (including shed human and animal skin). Dust mites are a microscopic type of insect that you cannot see with the naked eye. High levels of dust mites have been detected from mattresses, pillows, carpets, upholstered furniture, bed covers, clothes, soft toys and any woven material.  The principal allergen of the dust mite is found in its feces. A gram of dust may contain 1,000 mites and 250,000 fecal particles. Mite antigen is easily measured in the air during house cleaning activities. Dust mites do not bite and do not cause harm to humans, other than by triggering allergies/asthma.  Ways to decrease your exposure to dust mites in your home:  1. Encase mattresses, box springs and pillows with a mite-impermeable barrier or cover  2. Wash sheets, blankets and drapes weekly in hot water (130 F) with detergent and dry them in a dryer on the hot setting.  3. Have the room cleaned frequently with a vacuum cleaner and a damp dust-mop. For carpeting or rugs, vacuuming with a vacuum cleaner equipped with a high-efficiency particulate air (HEPA) filter. The dust mite allergic individual should not be in a room which is being cleaned and should wait 1 hour after cleaning before going into the room.  4. Do not sleep on upholstered furniture (eg, couches).  5. If possible removing carpeting, upholstered furniture and drapery from the home is ideal. Horizontal blinds should be eliminated in the rooms where the person spends the most time (bedroom, study, television room). Washable vinyl, roller-type shades are optimal.  6. Remove all non-washable stuffed toys from the bedroom. Wash stuffed toys weekly like sheets and blankets above.  7. Reduce indoor humidity to less than 50%. Inexpensive humidity monitors can be purchased at most hardware stores. Do not use a humidifier as can make the problem worse and are not recommended.

## 2023-08-02 NOTE — Progress Notes (Signed)
[address] [place] [address] Dept: [phone]  FOLLOW UP NOTE  Patient ID: [name] [name], female    DOB: Jul 08, 1966  Age: 57 y.o. MRN: [id number] Date of Office Visit: 08/02/2023  Assessment  Chief Complaint: Follow-up and Allergic Rhinitis   HPI [name] [name] is a 57 year old female who presents to the clinic for follow-up visit.  She was last seen in this clinic on 06/23/2022 as a new patient by Dr. Delorse Lek for evaluation of chronic rhinitis, reactive airway disease likely driven by reflux, food allergy, and elevated tryptase level with her last tryptase level 20.3 on 07/14/2022.  At today's visit, she reports that she is here to follow up with her allergy symptoms and her elevated tryptase. She reports that she continues to experience symptoms including nasal swelling, migraine and diarrhea occasionally with food ingestion. She reports these symptoms occur randomly and not with any particular foods. She reports some dysphagia that began about 20 years ago and occurs mostly with the ingestion of liquids. She continues to follow up with her GI specialist for evaluation and treatment of dysphagia. She denies food lodging or vomiting. She occasionally hears wheezing occurring in the back of her throat at night while lying down. She reports occasional heartburn for which she occasionally takes omeprazole with relief of symptoms. She continues to experience random episodes of facial flushing, itch occurring on both arms, and intermittent brain fog. She did have an elevated tryptase at her last visit that led to an N-Methylhistamine random urine test which was negative. Ckit testing was ordered at that time, however, was not collected.   Reactive airway disease is reported as well controlled with wheeze at night as the main symptom. She denies shortness of breath and cough with activity and rest. She does not use her albuterol.   Allergic rhinitis is moderately well controlled with some  itch for which she continues an occasional antihistamine.   Her current medications are listed in the chart.     Drug Allergies:  Allergies  Allergen Reactions   Grapefruit Extract     Results of an allergy test   Vicodin [Hydrocodone-Acetaminophen] Swelling    rash   Penicillins Swelling and Rash    Physical Exam: BP 104/60   Pulse (!) 103   Temp 98 F (36.7 C) (Temporal)   Resp 16   Wt 184 lb 3.2 oz (83.6 kg)   LMP 10/04/2015   SpO2 95%   BMI 29.73 kg/m    Physical Exam Vitals reviewed.  Constitutional:      Appearance: Normal appearance.  HENT:     Head: Normocephalic and atraumatic.     Right Ear: Tympanic membrane normal.     Left Ear: Tympanic membrane normal.     Nose:     Comments: Bilateral nares slightly erythematous with thin clear nasal drainage noted. Pharynx normal. Ears normal. Eyes normal.    Mouth/Throat:     Pharynx: Oropharynx is clear.  Eyes:     Conjunctiva/sclera: Conjunctivae normal.  Cardiovascular:     Rate and Rhythm: Normal rate and regular rhythm.     Heart sounds: Normal heart sounds. No murmur heard. Pulmonary:     Effort: Pulmonary effort is normal.     Breath sounds: Normal breath sounds.     Comments: Lungs clear to auscultation Musculoskeletal:        General: Normal range of motion.     Cervical back: Normal range of motion and neck supple.  Skin:  General: Skin is warm and dry.  Neurological:     Mental Status: She is alert and oriented to person, place, and time.  Psychiatric:        Mood and Affect: Mood normal.        Behavior: Behavior normal.        Thought Content: Thought content normal.        Judgment: Judgment normal.     Assessment and Plan: 1. Mild intermittent reactive airway disease without complication   2. LPRD (laryngopharyngeal reflux disease)   3. Elevated serum tryptase   4. Perennial allergic rhinitis     Patient Instructions  Reactive airway disease Well controlled Continue albuterol  2 puffs once every 4 hours as needed for cough or wheeze You may use albuterol 2 puffs 5 to 15 minutes before activity to decrease cough or wheeze  Reflux Moderately well controlled Continue dietary and lifestyle modifications as listed below Continue omeprazole 20 mg once a day for the next 30 days and note if wheezing at night resolves  Elevated tryptase Not well controlled We have placed some labs to help [place] evaluate your tryptase level. We will call you when the results become available. We will call you when the appropriate 24-hour urine tests have been ordered. You have been given an order to check your tryptase during a reaction. You may go to any [facility] to get this lab drawn during a reaction Begin a journal of events that occurred for up to 6 hours before your symptoms began including foods and beverages consumed, soaps or perfumes you had contact with, and medications.   Allergic rhinitis Moderately well controlled Continue allergen avoidance measures directed toward dust mites as listed below Continue an antihistamine as needed for a runny nose or itch Consider saline nasal rinses as needed for nasal symptoms. Use this before any medicated nasal sprays for best result  Call the clinic if this treatment plan is not working well for you  Follow up in 1 month or sooner if needed.   Return in about 4 weeks (around 08/30/2023), or if symptoms worsen or fail to improve.    Thank you for the opportunity to care for this patient.  Please do not hesitate to contact me with questions.  Thermon Leyland, FNP [facility]

## 2023-08-03 ENCOUNTER — Encounter: Payer: Self-pay | Admitting: Family Medicine

## 2023-08-03 DIAGNOSIS — R748 Abnormal levels of other serum enzymes: Secondary | ICD-10-CM | POA: Insufficient documentation

## 2023-08-03 DIAGNOSIS — J3089 Other allergic rhinitis: Secondary | ICD-10-CM | POA: Insufficient documentation

## 2023-08-03 DIAGNOSIS — J45909 Unspecified asthma, uncomplicated: Secondary | ICD-10-CM | POA: Insufficient documentation

## 2023-08-03 DIAGNOSIS — K219 Gastro-esophageal reflux disease without esophagitis: Secondary | ICD-10-CM | POA: Insufficient documentation

## 2023-08-07 ENCOUNTER — Other Ambulatory Visit: Payer: Self-pay | Admitting: Family Medicine

## 2023-08-07 ENCOUNTER — Telehealth: Payer: Self-pay

## 2023-08-07 DIAGNOSIS — R748 Abnormal levels of other serum enzymes: Secondary | ICD-10-CM

## 2023-08-07 NOTE — Telephone Encounter (Signed)
Called per Aura Camps FNP patient informed lab order being mailed she can do this at any [facility] location at her convenience.

## 2023-08-14 LAB — ALPHA-GAL PANEL
Allergen Lamb IgE: <0.1 kU/L
Beef IgE: <0.1 kU/L
IgE (Immunoglobulin E), Serum: 133 [IU]/mL (ref 6–495)
O215-IgE Alpha-Gal: <0.1 kU/L
Pork IgE: <0.1 kU/L

## 2023-08-14 LAB — PROSTAGLANDIN D2, SERUM: Prostaglandin D2, serum: 8.4 pg/mL

## 2023-08-30 ENCOUNTER — Ambulatory Visit: Payer: BC Managed Care – PPO | Admitting: Family Medicine

## 2023-08-30 ENCOUNTER — Encounter: Payer: Self-pay | Admitting: Family Medicine

## 2023-08-30 ENCOUNTER — Other Ambulatory Visit: Payer: Self-pay

## 2023-08-30 VITALS — BP 100/60 | HR 105 | Temp 98.0°F | Resp 20 | Wt 185.1 lb

## 2023-08-30 DIAGNOSIS — K219 Gastro-esophageal reflux disease without esophagitis: Secondary | ICD-10-CM

## 2023-08-30 DIAGNOSIS — J3089 Other allergic rhinitis: Secondary | ICD-10-CM | POA: Diagnosis not present

## 2023-08-30 DIAGNOSIS — J300 Vasomotor rhinitis: Secondary | ICD-10-CM | POA: Diagnosis not present

## 2023-08-30 DIAGNOSIS — R748 Abnormal levels of other serum enzymes: Secondary | ICD-10-CM | POA: Diagnosis not present

## 2023-08-30 DIAGNOSIS — J4520 Mild intermittent asthma, uncomplicated: Secondary | ICD-10-CM

## 2023-08-30 MED ORDER — CROMOLYN SODIUM 5.2 MG/ACT NA AERS: 1.0000 | INHALATION_SPRAY | Freq: Four times a day (QID) | NASAL | 12 refills | Status: DC

## 2023-08-30 MED ORDER — LORATADINE 10 MG PO TABS: 10.0000 mg | ORAL_TABLET | Freq: Every day | ORAL | 5 refills | Status: DC

## 2023-08-30 MED ORDER — CROMOLYN SODIUM 4 % OP SOLN: 2.0000 [drp] | Freq: Four times a day (QID) | OPHTHALMIC | 12 refills | Status: DC

## 2023-08-30 NOTE — Progress Notes (Signed)
[address] [place] [address] Dept: [phone]  FOLLOW UP NOTE  Patient ID: [name] [name], female    DOB: August 07, 1966  Age: 57 y.o. MRN: [id number] Date of Office Visit: 08/30/2023  Assessment  Chief Complaint: Follow-up (Wants to do labs today due to her symptoms.)  HPI [name] [name] is a 57 year old female who presents to the clinic for follow-up visit.  She was last seen in this clinic on 08/02/2023 by Thermon Leyland, FNP, for evaluation of reactive airway disease, allergic rhinitis, reflux, food allergy, and elevated tryptase.  At that time she reported symptoms including swelling in her nostrils, diarrhea, migraine, flushing, itching, and brain fog.  At today's visit, she reports that, earlier today, she ate a [employer] worker, [employer] with wheat and cheese and shortly after that she began to develop symptoms typical of a flare beginning with nasal symptoms, flushing, migraine, and diarrhea. She also reports frequent itching. She reports that these attacks usually begin after eating, however, do not happen each time she eats..She also reports other symptoms such as brain fog and nausea. She frequently experiences flushing sometimes accompanied by heat.  She has had a parietal workup for mast cell diseases, however, has not had some of the urine collection completed at this time. Her tryptase levels have been 17.3 on 06/24/2023 and 20.3 at the one month follow up. CKIT mutation was not detected. Alpha gal was negative, prostaglandin D2 serum was within normal limits. Testing that has not resulted at this time includes leukotriene E4 24 hour urine and 2,3 Dinor 11 Beta-prostaglandin F2 Alpha.  Allergic rhinitis is reported as moderately well-controlled with occasional nasal symptoms mainly associated with a multisymptom attack.  She continues loratadine 10 mg once a day as needed and occasionally uses nasal sprays with relief of symptoms.  Her last environmental allergy skin testing was in 2017 and  was positive to dust mite.    She is not currently avoiding any foods in her last food allergy testing to selected foods on 06/23/2022 was negative.    Her current medications are listed in the chart.  Drug Allergies:  Allergies  Allergen Reactions   Grapefruit Extract     Results of an allergy test   Vicodin [Hydrocodone-Acetaminophen] Swelling    rash   Penicillins Swelling and Rash    Physical Exam: BP 100/60   Pulse (!) 105   Temp 98 F (36.7 C) (Temporal)   Resp 20   Wt 185 lb 1.6 oz (84 kg)   LMP 10/04/2015   SpO2 95%   BMI 29.88 kg/m    Physical Exam Vitals reviewed.  Constitutional:      Appearance: Normal appearance.  HENT:     Head: Normocephalic and atraumatic.     Right Ear: Tympanic membrane normal.     Left Ear: Tympanic membrane normal.     Nose:     Comments: Bilateral nares slightly erythematous with thick clear nasal drainage noted. Pharynx normal. Ears normal. Eyes normal.     Mouth/Throat:     Pharynx: Oropharynx is clear.  Eyes:     Conjunctiva/sclera: Conjunctivae normal.  Cardiovascular:     Rate and Rhythm: Normal rate and regular rhythm.     Heart sounds: Normal heart sounds. No murmur heard. Pulmonary:     Effort: Pulmonary effort is normal.     Breath sounds: Normal breath sounds.     Comments: Lungs clear to auscultation Musculoskeletal:        General: Normal range of  motion.     Cervical back: Normal range of motion and neck supple.  Skin:    General: Skin is warm and dry.     Comments: Pinpoint scabs on bilateral forearms where she notes that she has been scratching. No open areas or drainage noted.  Neurological:     Mental Status: She is alert and oriented to person, place, and time.  Psychiatric:        Mood and Affect: Mood normal.        Behavior: Behavior normal.        Thought Content: Thought content normal.        Judgment: Judgment normal.     Assessment and Plan: 1. Elevated serum tryptase   2. Perennial  allergic rhinitis   3. LPRD (laryngopharyngeal reflux disease)   4. Vasomotor rhinitis   5. Mild intermittent reactive airway disease without complication     Meds ordered this encounter  Medications   loratadine (CLARITIN) 10 MG tablet    Sig: Take 1 tablet (10 mg total) by mouth daily.    Dispense:  30 tablet    Refill:  5   cromolyn (OPTICROM) 4 % ophthalmic solution    Sig: Place 2 drops into both eyes 4 (four) times daily.    Dispense:  10 mL    Refill:  12   cromolyn (NASALCROM) 5.2 MG/ACT nasal spray    Sig: Place 1 spray into both nostrils 4 (four) times daily.    Dispense:  26 mL    Refill:  12    Patient Instructions  Elevated tryptase Not well controlled We have placed some labs to help [place] evaluate your tryptase level. We will call you when the results become available. Continue loratadine 10 mg once a day to prevent itch Begin hydroxyzine 25 mg once at bedtime if needed for nighttime itch You may use Benadryl as needed for breakthrough symptoms Begin NasalCrom 1 spray in each nostril 3-4 times a day as needed for nasal symptoms Begin cromolyn eyedrops 1 to 2 drops in each eye 4 times a day as needed for red or itchy eyes Begin montelukast 10 mg once a day Begin famotidine 20 mg twice a day for itch control Begin a journal of events that occurred for up to 6 hours before your symptoms began including foods and beverages consumed, soaps or perfumes you had contact with, and medications.   Allergic rhinitis Moderately well controlled Continue allergen avoidance measures directed toward dust mites as listed below Continue loratadine 10 mg once a day as listed above Consider saline nasal rinses as needed for nasal symptoms. Use this before any medicated nasal sprays for best result  Asthma Well controlled Continue albuterol 2 puffs once every 4 hours as needed for cough or wheeze You may use albuterol 2 puffs 5 to 15 minutes before activity to decrease cough or  wheeze  Reflux Moderately well controlled Continue dietary and lifestyle modifications as listed below Continue omeprazole 20 mg once a day for the next 30 days and note if wheezing at night resolves Call the clinic if this treatment plan is not working well for you  Follow up in 2 months or sooner if needed.  Please visit the following websites to find more information about mast cell diseases The Mast Cell Disease Society: tmsforcure.org The American Initiative in Mast Cell Diseases: aimcd.net   Thank you for the opportunity to care for this patient.  Please do not hesitate to contact me with  questions.  Thermon Leyland, FNP [facility]

## 2023-08-30 NOTE — Patient Instructions (Addendum)
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 We will call you when the results become available. Continue loratadine 10 mg once a day to prevent itch Begin hydroxyzine 25 mg once at bedtime if needed for nighttime itch You may use Benadryl as needed for breakthrough symptoms Begin NasalCrom 1 spray in each nostril 3-4 times a day as needed for nasal symptoms Begin cromolyn eyedrops 1 to 2 drops in each eye 4 times a day as needed for red or itchy eyes Begin montelukast 10 mg once a day Begin famotidine 20 mg twice a day for itch control Begin a journal of events that occurred for up to 6 hours before your symptoms began including foods and beverages consumed, soaps or perfumes you had contact with, and medications.   Allergic rhinitis Moderately well controlled Continue allergen avoidance measures directed toward dust mites as listed below Continue loratadine 10 mg once a day as listed above Consider saline nasal rinses as needed for nasal symptoms. Use this before any medicated nasal sprays for best result  Asthma Well controlled Continue albuterol 2 puffs once every 4 hours as needed for cough or wheeze You may use albuterol 2 puffs 5 to 15 minutes before activity to decrease cough or wheeze  Reflux Moderately well controlled Continue dietary and lifestyle modifications as listed below Continue omeprazole 20 mg once a day for the next 30 days and note if wheezing at night resolves Call the clinic if this treatment plan is not working well for you  Follow up in 2 months or sooner if needed.  Please visit the following websites to find more information about mast cell diseases The Mast Cell Disease Society: tmsforcure.org The American Initiative in Mast Cell Diseases: aimcd.net  Control of Dust Mite Allergen Dust mites play a major role in allergic asthma and rhinitis. They occur in environments with high humidity wherever  human skin is found. Dust mites absorb humidity from the atmosphere (ie, they do not drink) and feed on organic matter (including shed human and animal skin). Dust mites are a microscopic type of insect that you cannot see with the naked eye. High levels of dust mites have been detected from mattresses, pillows, carpets, upholstered furniture, bed covers, clothes, soft toys and any woven material. The principal allergen of the dust mite is found in its feces. A gram of dust may contain 1,000 mites and 250,000 fecal particles. Mite antigen is easily measured in the air during house cleaning activities. Dust mites do not bite and do not cause harm to humans, other than by triggering allergies/asthma.  Ways to decrease your exposure to dust mites in your home:  1. Encase mattresses, box springs and pillows with a mite-impermeable barrier or cover  2. Wash sheets, blankets and drapes weekly in hot water (130 F) with detergent and dry them in a dryer on the hot setting.  3. Have the room cleaned frequently with a vacuum cleaner and a damp dust-mop. For carpeting or rugs, vacuuming with a vacuum cleaner equipped with a high-efficiency particulate air (HEPA) filter. The dust mite allergic individual should not be in a room which is being cleaned and should wait 1 hour after cleaning before going into the room.  4. Do not sleep on upholstered furniture (eg, couches).  5. If possible removing carpeting, upholstered furniture and drapery from the home is ideal. Horizontal blinds should be eliminated in the rooms where the person spends the most  time (bedroom, study, television room). Washable vinyl, roller-type shades are optimal.  6. Remove all non-washable stuffed toys from the bedroom. Wash stuffed toys weekly like sheets and blankets above.  7. Reduce indoor humidity to less than 50%. Inexpensive humidity monitors can be purchased at most hardware stores. Do not use a humidifier as can make the problem  worse and are not recommended.

## 2023-09-01 LAB — TRYPTASE: Tryptase: 19.6 ug/L — ABNORMAL HIGH (ref 2.2–13.2)

## 2023-09-03 ENCOUNTER — Other Ambulatory Visit: Payer: Self-pay | Admitting: Family Medicine

## 2023-09-05 LAB — SPECIMEN STATUS REPORT

## 2023-09-08 LAB — LEUKOTRIENE E4, 24 HR, U
Collection Duration: 24 h
Creatinine Concentration,24 HR: 112 mg/dL
Creatinine, 24 HR, U: 896 mg/(24.h) (ref 603–1783)
Leukotriene E4, U: 88 pg/mg{creat} (ref <=104)
Urine Volume: 800 mL

## 2023-09-11 LAB — LEUKOTRIENE E4, 24 HR, U
Collection Duration: 24 h
Creatinine Concentration,24 HR: 113 mg/dL
Creatinine, 24 HR, U: 904 mg/(24.h) (ref 603–1783)
Leukotriene E4, U: 71 pg/mg{creat} (ref <=104)
Urine Volume: 800 mL

## 2023-09-17 LAB — OTHER LAB TEST: PDF: 0

## 2023-09-20 ENCOUNTER — Other Ambulatory Visit: Payer: Self-pay

## 2023-09-20 MED ORDER — CROMOLYN SODIUM 5.2 MG/ACT NA AERS: 1.0000 | INHALATION_SPRAY | Freq: Four times a day (QID) | NASAL | 12 refills | Status: DC

## 2023-09-20 NOTE — Progress Notes (Signed)
See other lab results note for same day

## 2023-09-20 NOTE — Progress Notes (Signed)
Can you please let this patient know that her lab testing has finally all returned and is all negative. Can you please ask her how she is doing with the new medications?  Can you please have her follow up with Dr. Delorse Lek. Thank you

## 2024-07-18 ENCOUNTER — Emergency Department (HOSPITAL_COMMUNITY)
Admission: EM | Admit: 2024-07-18 | Discharge: 2024-07-18 | Disposition: A | Discharge status: Home / Self Care | Attending: Emergency Medicine | Admitting: Emergency Medicine

## 2024-07-18 ENCOUNTER — Other Ambulatory Visit: Payer: Self-pay

## 2024-07-18 ENCOUNTER — Encounter (HOSPITAL_COMMUNITY): Payer: Self-pay

## 2024-07-18 ENCOUNTER — Emergency Department (HOSPITAL_COMMUNITY)

## 2024-07-18 DIAGNOSIS — Z87442 Personal history of urinary calculi: Secondary | ICD-10-CM | POA: Diagnosis not present

## 2024-07-18 DIAGNOSIS — D72829 Elevated white blood cell count, unspecified: Secondary | ICD-10-CM | POA: Insufficient documentation

## 2024-07-18 DIAGNOSIS — J45909 Unspecified asthma, uncomplicated: Secondary | ICD-10-CM | POA: Insufficient documentation

## 2024-07-18 DIAGNOSIS — R1031 Right lower quadrant pain: Secondary | ICD-10-CM | POA: Diagnosis present

## 2024-07-18 LAB — CBC WITH DIFFERENTIAL/PLATELET
Abs Immature Granulocytes: 0.04 K/uL (ref 0.00–0.07)
Basophils Absolute: 0.1 K/uL (ref 0.0–0.1)
Basophils Relative: 1 %
Eosinophils Absolute: 0.3 K/uL (ref 0.0–0.5)
Eosinophils Relative: 2 %
HCT: 41.5 % (ref 36.0–46.0)
Hemoglobin: 13.9 g/dL (ref 12.0–15.0)
Immature Granulocytes: 0 %
Lymphocytes Relative: 33 %
Lymphs Abs: 3.7 K/uL (ref 0.7–4.0)
MCH: 30.8 pg (ref 26.0–34.0)
MCHC: 33.5 g/dL (ref 30.0–36.0)
MCV: 92 fL (ref 80.0–100.0)
Monocytes Absolute: 0.9 K/uL (ref 0.1–1.0)
Monocytes Relative: 8 %
Neutro Abs: 6.3 K/uL (ref 1.7–7.7)
Neutrophils Relative %: 56 %
Platelets: 469 K/uL — ABNORMAL HIGH (ref 150–400)
RBC: 4.51 MIL/uL (ref 3.87–5.11)
RDW: 13.7 % (ref 11.5–15.5)
WBC: 11.3 K/uL — ABNORMAL HIGH (ref 4.0–10.5)
nRBC: 0 % (ref 0.0–0.2)

## 2024-07-18 LAB — COMPREHENSIVE METABOLIC PANEL WITH GFR
ALT: 14 U/L (ref 0–44)
AST: 20 U/L (ref 15–41)
Albumin: 3.6 g/dL (ref 3.5–5.0)
Alkaline Phosphatase: 109 U/L (ref 38–126)
Anion gap: 11 (ref 5–15)
BUN: 10 mg/dL (ref 6–20)
CO2: 20 mmol/L — ABNORMAL LOW (ref 22–32)
Calcium: 9.3 mg/dL (ref 8.9–10.3)
Chloride: 105 mmol/L (ref 98–111)
Creatinine, Ser: 0.74 mg/dL (ref 0.44–1.00)
GFR, Estimated: >60 mL/min (ref >60)
Glucose, Bld: 114 mg/dL — ABNORMAL HIGH (ref 70–99)
Potassium: 3.5 mmol/L (ref 3.5–5.1)
Sodium: 136 mmol/L (ref 135–145)
Total Bilirubin: 0.6 mg/dL (ref 0.0–1.2)
Total Protein: 7.7 g/dL (ref 6.5–8.1)

## 2024-07-18 LAB — URINALYSIS, ROUTINE W REFLEX MICROSCOPIC
Bilirubin Urine: NEGATIVE
Glucose, UA: NEGATIVE mg/dL
Hgb urine dipstick: NEGATIVE
Ketones, ur: NEGATIVE mg/dL
Leukocytes,Ua: NEGATIVE
Nitrite: NEGATIVE
Protein, ur: NEGATIVE mg/dL
Specific Gravity, Urine: 1.002 — ABNORMAL LOW (ref 1.005–1.030)
pH: 6 (ref 5.0–8.0)

## 2024-07-18 LAB — LIPASE, BLOOD: Lipase: 33 U/L (ref 11–51)

## 2024-07-18 MED ORDER — IOHEXOL 350 MG/ML SOLN
75.0000 mL | Freq: Once | INTRAVENOUS | Status: AC | PRN
  Administered 2024-07-18: 75 mL via INTRAVENOUS

## 2024-07-18 MED ORDER — DICYCLOMINE HCL 20 MG PO TABS: 20.0000 mg | ORAL_TABLET | Freq: Two times a day (BID) | ORAL | 0 refills | Status: DC

## 2024-07-18 NOTE — Discharge Instructions (Addendum)
 It was a pleasure taking care of you today.  As discussed, your labs are reassuring.  CT abdomen did not show any evidence of appendicitis or other abnormalities.  You may take over-the-counter ibuprofen  or Tylenol  as needed for pain.  Follow-up with PCP if symptoms do not improve over the next few days.  Return to the ER for any worsening symptoms.

## 2024-07-18 NOTE — ED Triage Notes (Signed)
 Pt c.o RLQ pain x 2 days, no other symptoms.

## 2024-07-18 NOTE — ED Provider Notes (Signed)
 [facility] Provider Note   CSN: [id number] Arrival date & time: 07/18/24  9048     Patient presents with: Abdominal Pain   [name] [name] is a 58 y.o. female with a past medical history significant for asthma, history of migraines, history of kidney stones, and GERD who presents to the ED due to right lower quadrant abdominal pain x 2 days.  Denies associated nausea, vomiting, and diarrhea.  No vaginal or urinary symptoms.  No concern for STIs. Last bowel movement was a few days ago. Patient concerned about appendicitis.   History obtained from patient and past medical records. No interpreter used during encounter.       Prior to Admission medications   Medication Sig Start Date End Date Taking? Authorizing Provider  dicyclomine  (BENTYL ) 20 MG tablet Take 1 tablet (20 mg total) by mouth 2 (two) times daily. 07/18/24  Yes Derald Lorge, Aleck BROCKS, PA-C  ALPRAZolam (XANAX) 0.5 MG tablet Take 0.5 mg by mouth daily as needed for anxiety.    [provider]  cromolyn  (NASALCROM ) 5.2 MG/ACT nasal spray Place 1 spray into both nostrils 4 (four) times daily. 09/20/23   Cari Arlean HERO, FNP  cromolyn  (OPTICROM ) 4 % ophthalmic solution Place 2 drops into both eyes 4 (four) times daily. 08/30/23   Cari Arlean HERO, FNP  ipratropium (ATROVENT ) 0.06 % nasal spray PLACE 2 SPRAY EACH NOSTRIL 15-20 MINUTES PRIOR TO EATING BREADS/ALCOHOL 10/02/22   Padgett, Danita Macintosh, MD  loratadine  (CLARITIN ) 10 MG tablet Take 1 tablet (10 mg total) by mouth daily. 08/30/23   Cari Arlean HERO, FNP  omeprazole (PRILOSEC) 40 MG capsule Take 40 mg by mouth daily.    [provider]  sertraline (ZOLOFT) 50 MG tablet Take 75 mg by mouth daily. 08/14/21   [provider]  SUMAtriptan (IMITREX) 100 MG tablet Take 50 mg by mouth every 2 (two) hours as needed. For migraine    [provider]    Allergies: Grapefruit extract, Vicodin  [hydrocodone-acetaminophen ], and Penicillins    Review of Systems  Constitutional:  Negative for chills and fever.  Respiratory:  Negative for shortness of breath.   Cardiovascular:  Negative for chest pain.  Gastrointestinal:  Positive for abdominal pain. Negative for diarrhea, nausea and vomiting.  Genitourinary:  Negative for dysuria and vaginal discharge.    Updated Vital Signs BP 112/71 (BP Location: Right Arm)   Pulse (!) 101   Temp 98.3 F (36.8 C) (Oral)   Resp 15   Ht 5' 6 (1.676 m)   Wt 85.7 kg   LMP 10/04/2015   SpO2 95%   BMI 30.51 kg/m   Physical Exam Vitals and nursing note reviewed.  Constitutional:      General: [name] [name] is not in acute distress.    Appearance: [name] [name] is not ill-appearing.  HENT:     Head: Normocephalic.  Eyes:     Pupils: Pupils are equal, round, and reactive to light.  Cardiovascular:     Rate and Rhythm: Normal rate and regular rhythm.     Pulses: Normal pulses.     Heart sounds: Normal heart sounds. No murmur heard.    No friction rub. No gallop.  Pulmonary:     Effort: Pulmonary effort is normal.     Breath sounds: Normal breath sounds.  Abdominal:     General: Abdomen is flat. There is no distension.     Palpations: Abdomen is soft.     Tenderness: There  is abdominal tenderness. There is no guarding or rebound.     Comments: RLQ tenderness without rebound or guarding. Does have some slight tenderness in RUQ.   Musculoskeletal:        General: Normal range of motion.     Cervical back: Neck supple.  Skin:    General: Skin is warm and dry.  Neurological:     General: No focal deficit present.     Mental Status: [name] [name] is alert.  Psychiatric:        Mood and Affect: Mood normal.        Behavior: Behavior normal.     (all labs ordered are listed, but only abnormal results are displayed) Labs Reviewed  COMPREHENSIVE METABOLIC PANEL WITH GFR - Abnormal; Notable for the following components:      Result Value   CO2 20 (*)     Glucose, Bld 114 (*)    All other components within normal limits  CBC WITH DIFFERENTIAL/PLATELET - Abnormal; Notable for the following components:   WBC 11.3 (*)    Platelets 469 (*)    All other components within normal limits  URINALYSIS, ROUTINE W REFLEX MICROSCOPIC - Abnormal; Notable for the following components:   Color, Urine STRAW (*)    Specific Gravity, Urine 1.002 (*)    All other components within normal limits  LIPASE, BLOOD    EKG: None  Radiology: CT ABDOMEN PELVIS W CONTRAST Result Date: 07/18/2024 CLINICAL DATA:  RLQ abdominal pain EXAM: CT ABDOMEN AND PELVIS WITH CONTRAST TECHNIQUE: Multidetector CT imaging of the abdomen and pelvis was performed using the standard protocol following bolus administration of intravenous contrast. RADIATION DOSE REDUCTION: This exam was performed according to the departmental dose-optimization program which includes automated exposure control, adjustment of the mA and/or kV according to patient size and/or use of iterative reconstruction technique. CONTRAST:  75mL OMNIPAQUE  IOHEXOL  350 MG/ML SOLN COMPARISON:  February 13, 2014, November 15, 2018 FINDINGS: Lower chest: No focal airspace consolidation. Trace pericardial effusion. Hepatobiliary: No mass.Unchanged cysts in both lobes of the liver.No radiopaque stones or wall thickening of the gallbladder. No intrahepatic or extrahepatic biliary ductal dilation. The portal veins are patent. Pancreas: No mass or main ductal dilation. No peripancreatic inflammation or fluid collection. Spleen: Normal size. No mass. Adrenals/Urinary Tract: No adrenal masses. No renal mass. No nephrolithiasis or hydronephrosis. The urinary bladder is distended without focal abnormality. Stomach/Bowel: The stomach is decompressed without focal abnormality. No small bowel wall thickening or inflammation. No small bowel obstruction.The appendix was not visualized. No right lower quadrant or pericecal inflammatory changes to  suggest acute appendicitis. Sigmoid colonic diverticulosis. No changes of acute diverticulitis. Vascular/Lymphatic: No aortic aneurysm. No intraabdominal or pelvic lymphadenopathy. Reproductive: Age-related atrophy of the uterus and ovaries. No concerning adnexal mass.No free pelvic fluid. Other: No pneumoperitoneum, ascites, or mesenteric inflammation. Musculoskeletal: No acute fracture or destructive lesion. IMPRESSION: No acute intra-abdominal or pelvic abnormality. Electronically Signed   By: Rogelia Myers M.D.   On: 07/18/2024 13:05     Procedures   Medications Ordered in the ED  iohexol  (OMNIPAQUE ) 350 MG/ML injection 75 mL (75 mLs Intravenous Contrast Given 07/18/24 1148)    Clinical Course as of 07/18/24 1342  Fri Jul 18, 2024  1118 WBC(!): 11.3 [CA]    Clinical Course User Index [CA] Lorelle Aleck BROCKS, PA-C  Medical Decision Making Amount and/or Complexity of Data Reviewed Labs: ordered. Decision-making details documented in ED Course. Radiology: ordered and independent interpretation performed. Decision-making details documented in ED Course.  Risk Prescription drug management.   This patient presents to the ED for concern of RLQ abdominal pain, this involves an extensive number of treatment options, and is a complaint that carries with it a high risk of complications and morbidity.  The differential diagnosis includes appendicitis, diverticulitis, bowel obstruction, PID, etc  58 year old female presents to the ED due to right lower quadrant abdominal pain x 2 days.  Has had a few previous abdominal operations.  Appendix intact.  No vaginal or urinary symptoms.  Last BM a few days ago.  Upon arrival patient mildly tachycardic at 104 with otherwise unremarkable vitals.  Patient well-appearing on exam.  Abdomen soft, nondistended with right lower quadrant and right upper quadrant tenderness.  No rebound or guarding.  Routine labs ordered.  CT  abdomen ordered to rule out appendicitis.  Patient declined pain medication at this time.  CBC significant for slight leukocytosis at 11.3.  Normal hemoglobin.  Thrombocytosis at 469.  CMP reassuring.  Normal renal function.  Lipase normal.  Low suspicion for pancreatitis.  UA negative for signs of infection.  Low suspicion for acute cystitis or pyelonephritis.  CT abdomen personally reviewed and interpreted which is negative for any acute abnormalities.  Unclear etiology of abdominal pain.  Low suspicion for pelvic etiology given reassuring CT scan.  Patient has also had her ovaries removed.  Low suspicion for ovarian torsion.  No concern for STIs.  Low suspicion for PID.  Discharged with symptomatic treatment.  Patient stable for discharge. Strict ED precautions discussed with patient. Patient states understanding and agrees to plan. Patient discharged home in no acute distress and stable vitals.  Hx kidney stones Has PCP     Final diagnoses:  Right lower quadrant abdominal pain    ED Discharge Orders          Ordered    dicyclomine  (BENTYL ) 20 MG tablet  2 times daily        07/18/24 1341               Lilliann Rossetti C, PA-C 07/18/24 1344    Emil Share, DO 07/18/24 1517

## 2024-09-04 ENCOUNTER — Other Ambulatory Visit: Payer: Self-pay | Admitting: General Surgery

## 2024-09-04 DIAGNOSIS — N6320 Unspecified lump in the left breast, unspecified quadrant: Secondary | ICD-10-CM

## 2024-11-05 ENCOUNTER — Encounter: Payer: Self-pay | Admitting: Neurology

## 2025-01-02 ENCOUNTER — Ambulatory Visit: Payer: PRIVATE HEALTH INSURANCE | Admitting: Neurology

## 2025-01-02 ENCOUNTER — Other Ambulatory Visit: Payer: PRIVATE HEALTH INSURANCE

## 2025-01-02 ENCOUNTER — Encounter: Payer: Self-pay | Admitting: Neurology

## 2025-01-02 VITALS — BP 100/78 | HR 93 | Ht 66.0 in | Wt 184.0 lb

## 2025-01-02 DIAGNOSIS — R5383 Other fatigue: Secondary | ICD-10-CM

## 2025-01-02 DIAGNOSIS — G43109 Migraine with aura, not intractable, without status migrainosus: Secondary | ICD-10-CM

## 2025-01-02 DIAGNOSIS — M542 Cervicalgia: Secondary | ICD-10-CM

## 2025-01-02 LAB — VITAMIN B12: Vitamin B-12: 490 pg/mL (ref 200–1100)

## 2025-01-02 LAB — TSH: TSH: 2.22 m[IU]/L (ref 0.40–4.50)

## 2025-01-02 MED ORDER — SUMATRIPTAN SUCCINATE 100 MG PO TABS: 50.0000 mg | ORAL_TABLET | ORAL | 11 refills | Status: AC | PRN

## 2025-01-02 MED ORDER — PROPRANOLOL HCL 20 MG PO TABS: 20.0000 mg | ORAL_TABLET | Freq: Every day | ORAL | 3 refills | Status: AC

## 2025-01-02 NOTE — Patient Instructions (Addendum)
 I saw you today for migraines. I do not want to change your medications today since you are improving currently, but we will monitor closely. I also want to refer you to physical therapy for your neck. This can also help with headaches.  I will also get some blood work today. I will be in touch when I have your results.  Migraine prevention:  Continue propranolol  20 mg daily for now. Will have to monitor blood pressure as it is low today Migraine rescue:  Continue Sumatriptan  100 mg as needed at headache onset, can repeat in 2 hours if needed. Continue Phenergan 12.5 mg as needed for nausea Limit use of pain relievers to no more than 2 days out of week to prevent risk of rebound or medication-overuse headache. Keep headache diary  -Return to clinic in 6 months  Please let me know if you have any questions or concerns in the meantime.  The physicians and staff at [facility] are committed to providing excellent care. You may receive a survey requesting feedback about your experience at our office. We strive to receive very good responses to the survey questions. If you feel that your experience would prevent you from giving the office a very good  response, please contact our office to try to remedy the situation. We may be reached at [phone]. Thank you for taking the time out of your busy day to complete the survey.  Venetia Potters, MD [facility]  More migraine information: Be aware of common food triggers:  - Caffeine:  coffee, black tea, cola, Mt. Dew  - Chocolate  - Dairy:  aged cheeses (brie, blue, cheddar, gouda, Parmasan, provolone, romano, Swiss, etc), chocolate milk, buttermilk, sour cream, limit eggs and yogurt  - Nuts, peanut butter  - Alcohol  - Cereals/grains:  FRESH breads (fresh bagels, sourdough, doughnuts), yeast productions  - Processed/canned/aged/cured meats (pre-packaged deli meats, hotdogs)  - MSG/glutamate:  soy sauce, flavor enhancer,  pickled/preserved/marinated foods  - Sweeteners:  aspartame (Equal, Nutrasweet).  Sugar and Splenda are okay  - Vegetables:  legumes (lima beans, lentils, snow peas, fava beans, pinto peans, peas, garbanzo beans), sauerkraut, onions, olives, pickles  - Fruit:  avocados, bananas, citrus fruit (orange, lemon, grapefruit), mango  - Other:  Frozen meals, macaroni and cheese Routine exercise Stay adequately hydrated (aim for 64 oz water daily) Keep headache diary Maintain proper stress management Maintain proper sleep hygiene Do not skip meals Consider supplements:  magnesium citrate 400mg  daily, riboflavin 400mg  daily, coenzyme Q10 100mg  three times daily.

## 2025-07-29 ENCOUNTER — Ambulatory Visit: Payer: Self-pay | Admitting: Neurology
